# Patient Record
Sex: Female | Born: 1995 | Race: Black or African American | Hispanic: No | Marital: Single | Smoking: Never smoker
Health system: Southern US, Community
[De-identification: ages and names within clinical notes are randomized; demographics above are authoritative.]

## PROBLEM LIST (undated history)

## (undated) DIAGNOSIS — R011 Cardiac murmur, unspecified: Secondary | ICD-10-CM

## (undated) DIAGNOSIS — I10 Essential (primary) hypertension: Secondary | ICD-10-CM

## (undated) DIAGNOSIS — K5900 Constipation, unspecified: Secondary | ICD-10-CM

## (undated) DIAGNOSIS — T7840XA Allergy, unspecified, initial encounter: Secondary | ICD-10-CM

## (undated) DIAGNOSIS — G8929 Other chronic pain: Secondary | ICD-10-CM

## (undated) DIAGNOSIS — K219 Gastro-esophageal reflux disease without esophagitis: Secondary | ICD-10-CM

## (undated) DIAGNOSIS — M25569 Pain in unspecified knee: Secondary | ICD-10-CM

## (undated) DIAGNOSIS — J45909 Unspecified asthma, uncomplicated: Secondary | ICD-10-CM

## (undated) DIAGNOSIS — E282 Polycystic ovarian syndrome: Secondary | ICD-10-CM

## (undated) HISTORY — DX: Constipation, unspecified: K59.00

## (undated) HISTORY — DX: Allergy, unspecified, initial encounter: T78.40XA

## (undated) HISTORY — DX: Unspecified asthma, uncomplicated: J45.909

## (undated) HISTORY — DX: Polycystic ovarian syndrome: E28.2

---
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
## 2016-07-19 ENCOUNTER — Emergency Department (HOSPITAL_COMMUNITY)
Admission: EM | Admit: 2016-07-19 | Discharge: 2016-07-19 | Disposition: A | Discharge status: Home / Self Care | Payer: Medicaid Other | Attending: Emergency Medicine | Admitting: Emergency Medicine

## 2016-07-19 ENCOUNTER — Encounter (HOSPITAL_COMMUNITY): Payer: Self-pay | Admitting: Emergency Medicine

## 2016-07-19 ENCOUNTER — Emergency Department (HOSPITAL_COMMUNITY): Payer: Medicaid Other

## 2016-07-19 DIAGNOSIS — Z79899 Other long term (current) drug therapy: Secondary | ICD-10-CM | POA: Diagnosis not present

## 2016-07-19 DIAGNOSIS — J40 Bronchitis, not specified as acute or chronic: Secondary | ICD-10-CM | POA: Insufficient documentation

## 2016-07-19 DIAGNOSIS — R05 Cough: Secondary | ICD-10-CM | POA: Diagnosis present

## 2016-07-19 LAB — BASIC METABOLIC PANEL
Anion gap: 9 (ref 5–15)
BUN: 11 mg/dL (ref 6–20)
CO2: 23 mmol/L (ref 22–32)
Calcium: 9.4 mg/dL (ref 8.9–10.3)
Chloride: 107 mmol/L (ref 101–111)
Creatinine, Ser: 0.72 mg/dL (ref 0.44–1.00)
GFR calc Af Amer: >60 mL/min (ref >60)
GFR calc non Af Amer: >60 mL/min (ref >60)
Glucose, Bld: 80 mg/dL (ref 65–99)
Potassium: 4 mmol/L (ref 3.5–5.1)
Sodium: 139 mmol/L (ref 135–145)

## 2016-07-19 LAB — CBC WITH DIFFERENTIAL/PLATELET
Basophils Absolute: 0 10*3/uL (ref 0.0–0.1)
Basophils Relative: 0 %
Eosinophils Absolute: 0.1 10*3/uL (ref 0.0–0.7)
Eosinophils Relative: 2 %
HCT: 44.6 % (ref 36.0–46.0)
Hemoglobin: 14.5 g/dL (ref 12.0–15.0)
Lymphocytes Relative: 48 %
Lymphs Abs: 1.7 10*3/uL (ref 0.7–4.0)
MCH: 27.9 pg (ref 26.0–34.0)
MCHC: 32.5 g/dL (ref 30.0–36.0)
MCV: 85.9 fL (ref 78.0–100.0)
Monocytes Absolute: 0.2 10*3/uL (ref 0.1–1.0)
Monocytes Relative: 6 %
Neutro Abs: 1.5 10*3/uL — ABNORMAL LOW (ref 1.7–7.7)
Neutrophils Relative %: 44 %
Platelets: 266 10*3/uL (ref 150–400)
RBC: 5.19 MIL/uL — ABNORMAL HIGH (ref 3.87–5.11)
RDW: 12.2 % (ref 11.5–15.5)
WBC: 3.4 10*3/uL — ABNORMAL LOW (ref 4.0–10.5)

## 2016-07-19 LAB — D-DIMER, QUANTITATIVE: D-Dimer, Quant: <0.27 ug/mL-FEU (ref 0.00–0.50)

## 2016-07-19 MED ORDER — TRAMADOL HCL 50 MG PO TABS
50.0000 mg | ORAL_TABLET | Freq: Four times a day (QID) | ORAL | 0 refills | Status: DC | PRN
Start: 2016-07-19 — End: 2016-10-14

## 2016-07-19 MED ORDER — TRAMADOL HCL 50 MG PO TABS
50.0000 mg | ORAL_TABLET | Freq: Once | ORAL | Status: AC
  Administered 2016-07-19: 50 mg via ORAL
  Filled 2016-07-19: qty 1

## 2016-07-19 NOTE — ED Provider Notes (Signed)
[facility]PT Provider Note   CSN: [id number] Arrival date & time: 07/19/16  1058     History   Chief Complaint Chief Complaint  Patient presents with  . Ribcage pain    HPI Elaine Hunt is a 21 y.o. female.  Patient complains of a mild cough for 2 days nonproductive she also has left-sided lower anterior chest discomfort with inspiration    Cough  This is a new problem. The current episode started 2 days ago. The problem occurs constantly. The cough is non-productive. There has been no fever. Associated symptoms include chest pain. Pertinent negatives include no headaches. She has tried nothing for the symptoms. The treatment provided no relief. She is not a smoker.    Past Medical History:  Diagnosis Date  . Allergy   . Asthma   . Chronic knee pain   . Constipation   . GERD (gastroesophageal reflux disease)   . Heart murmur     Patient Active Problem List   Diagnosis Date Noted  . Normal weight, pediatric, BMI 5th to 84th percentile for age 26/19/2016  . Asthma, chronic 03/23/2013  . Acne 03/23/2013  . Knee pain, bilateral 03/23/2013  . Unspecified constipation 03/23/2013    History reviewed. No pertinent surgical history.  OB History    Gravida Para Term Preterm AB Living   0 0 0 0 0 0   SAB TAB Ectopic Multiple Live Births   0 0 0 0         Home Medications    Prior to Admission medications   Medication Sig Start Date End Date Taking? Authorizing Provider  medroxyPROGESTERone (DEPO-PROVERA) 150 MG/ML injection Inject 150 mg into the muscle every 3 (three) months.   Yes Historical Provider, MD  traMADol (ULTRAM) 50 MG tablet Take 1 tablet (50 mg total) by mouth every 6 (six) hours as needed. 07/19/16   [name], MD    Family History Family History  Problem Relation Age of Onset  . Heart failure Mother   . Hypertension Mother   . Arthritis Mother   . Asthma Mother   . COPD Mother   . Depression Mother   . Hyperlipidemia Mother   .  Kidney disease Mother   . Anxiety disorder Mother   . Panic disorder Mother   . Cancer Father     brain tumor  . Stroke Maternal Grandmother   . Heart disease Maternal Grandmother   . Marfan syndrome Maternal Grandmother   . Hyperlipidemia Maternal Grandmother   . Hypertension Maternal Uncle   . Heart failure Other   . Stroke Other     Social History Social History  Substance Use Topics  . Smoking status: Never Smoker  . Smokeless tobacco: Never Used  . Alcohol use No     Allergies   Ibuprofen and Penicillins   Review of Systems Review of Systems  Constitutional: Negative for appetite change and fatigue.  HENT: Negative for congestion, ear discharge and sinus pressure.   Eyes: Negative for discharge.  Respiratory: Positive for cough.   Cardiovascular: Positive for chest pain.  Gastrointestinal: Negative for abdominal pain and diarrhea.  Genitourinary: Negative for frequency and hematuria.  Musculoskeletal: Negative for back pain.  Skin: Negative for rash.  Neurological: Negative for seizures and headaches.  Psychiatric/Behavioral: Negative for hallucinations.     Physical Exam Updated Vital Signs BP (!) 135/95 (BP Location: Right Arm)   Pulse (!) 108   Temp 98.2 F (36.8 C) (Oral)   Resp  16   Ht 5\' 1"  (1.549 m)   Wt 167 lb (75.8 kg)   LMP  (LMP Unknown)   SpO2 100%   BMI 31.55 kg/m   Physical Exam  Constitutional: She is oriented to person, place, and time. She appears well-developed.  HENT:  Head: Normocephalic.  Eyes: Conjunctivae and EOM are normal. No scleral icterus.  Neck: Neck supple. No thyromegaly present.  Cardiovascular: Normal rate and regular rhythm.  Exam reveals no gallop and no friction rub.   No murmur heard. Pulmonary/Chest: No stridor. She has no wheezes. She has no rales. She exhibits no tenderness.  Tender anterior lower chest  Abdominal: She exhibits no distension. There is no tenderness. There is no rebound.  Musculoskeletal:  Normal range of motion. She exhibits no edema.  Lymphadenopathy:    She has no cervical adenopathy.  Neurological: She is oriented to person, place, and time. She exhibits normal muscle tone. Coordination normal.  Skin: No rash noted. No erythema.  Psychiatric: She has a normal mood and affect. Her behavior is normal.     ED Treatments / Results  Labs (all labs ordered are listed, but only abnormal results are displayed) Labs Reviewed  CBC WITH DIFFERENTIAL/PLATELET - Abnormal; Notable for the following:       Result Value   WBC 3.4 (*)    RBC 5.19 (*)    Neutro Abs 1.5 (*)    All other components within normal limits  BASIC METABOLIC PANEL  D-DIMER, QUANTITATIVE (NOT AT [facility])    EKG  EKG Interpretation None       Radiology Dg Chest 2 View  Result Date: 07/19/2016 CLINICAL DATA:  Cough for several days. EXAM: CHEST  2 VIEW COMPARISON:  08/05/2015. FINDINGS: Normal sized heart. Clear lungs. Mild diffuse peribronchial thickening. Unremarkable bones. IMPRESSION: Mild bronchitic changes. Electronically Signed   By: Beckie SaltsSteven  Reid M.D.   On: 07/19/2016 11:28    Procedures Procedures (including critical care time)  Medications Ordered in ED Medications  traMADol (ULTRAM) tablet 50 mg (50 mg Oral Given 07/19/16 1404)     Initial Impression / Assessment and Plan / ED Course  I have reviewed the triage vital signs and the nursing notes.  Pertinent labs & imaging results that were available during my care of the patient were reviewed by me and considered in my medical decision making (see chart for details).     Labs all unremarkable. Patient with bronchitis and pleuritis. She is treated just with pain medicine will follow-up as needed  Final Clinical Impressions(s) / ED Diagnoses   Final diagnoses:  Bronchitis    New Prescriptions New Prescriptions   TRAMADOL (ULTRAM) 50 MG TABLET    Take 1 tablet (50 mg total) by mouth every 6 (six) hours as needed.     Bethann BerkshireJoseph  Brandonlee Navis, MD 07/19/16 1414

## 2016-07-19 NOTE — Discharge Instructions (Signed)
Drink plenty of fluids and follow up with your md if not improving. °

## 2016-07-19 NOTE — ED Triage Notes (Signed)
Pt reports bilateral intercostal pain that worsens with deep inspiration x 2 days. States she has had an intermittent, non-productive cough.

## 2016-10-14 ENCOUNTER — Emergency Department (HOSPITAL_COMMUNITY): Payer: Medicaid Other

## 2016-10-14 ENCOUNTER — Encounter (HOSPITAL_COMMUNITY): Payer: Self-pay

## 2016-10-14 ENCOUNTER — Emergency Department (HOSPITAL_COMMUNITY)
Admission: EM | Admit: 2016-10-14 | Discharge: 2016-10-14 | Disposition: A | Discharge status: Home / Self Care | Payer: Medicaid Other | Attending: Emergency Medicine | Admitting: Emergency Medicine

## 2016-10-14 DIAGNOSIS — R1030 Lower abdominal pain, unspecified: Secondary | ICD-10-CM

## 2016-10-14 DIAGNOSIS — R52 Pain, unspecified: Secondary | ICD-10-CM

## 2016-10-14 DIAGNOSIS — Z79899 Other long term (current) drug therapy: Secondary | ICD-10-CM | POA: Insufficient documentation

## 2016-10-14 DIAGNOSIS — R1031 Right lower quadrant pain: Secondary | ICD-10-CM | POA: Insufficient documentation

## 2016-10-14 DIAGNOSIS — J45909 Unspecified asthma, uncomplicated: Secondary | ICD-10-CM | POA: Diagnosis not present

## 2016-10-14 LAB — CBC WITH DIFFERENTIAL/PLATELET
Basophils Absolute: 0 10*3/uL (ref 0.0–0.1)
Basophils Relative: 0 %
Eosinophils Absolute: 0 10*3/uL (ref 0.0–0.7)
Eosinophils Relative: 1 %
HCT: 44.4 % (ref 36.0–46.0)
Hemoglobin: 14.3 g/dL (ref 12.0–15.0)
Lymphocytes Relative: 36 %
Lymphs Abs: 1.7 10*3/uL (ref 0.7–4.0)
MCH: 27.4 pg (ref 26.0–34.0)
MCHC: 32.2 g/dL (ref 30.0–36.0)
MCV: 85.2 fL (ref 78.0–100.0)
Monocytes Absolute: 0.3 10*3/uL (ref 0.1–1.0)
Monocytes Relative: 7 %
Neutro Abs: 2.7 10*3/uL (ref 1.7–7.7)
Neutrophils Relative %: 56 %
Platelets: 313 10*3/uL (ref 150–400)
RBC: 5.21 MIL/uL — ABNORMAL HIGH (ref 3.87–5.11)
RDW: 13.1 % (ref 11.5–15.5)
WBC: 4.8 10*3/uL (ref 4.0–10.5)

## 2016-10-14 LAB — COMPREHENSIVE METABOLIC PANEL
ALT: 26 U/L (ref 14–54)
AST: 24 U/L (ref 15–41)
Albumin: 4.6 g/dL (ref 3.5–5.0)
Alkaline Phosphatase: 85 U/L (ref 38–126)
Anion gap: 12 (ref 5–15)
BUN: 12 mg/dL (ref 6–20)
CO2: 21 mmol/L — ABNORMAL LOW (ref 22–32)
Calcium: 9.7 mg/dL (ref 8.9–10.3)
Chloride: 105 mmol/L (ref 101–111)
Creatinine, Ser: 0.73 mg/dL (ref 0.44–1.00)
GFR calc Af Amer: >60 mL/min (ref >60)
GFR calc non Af Amer: >60 mL/min (ref >60)
Glucose, Bld: 88 mg/dL (ref 65–99)
Potassium: 4 mmol/L (ref 3.5–5.1)
Sodium: 138 mmol/L (ref 135–145)
Total Bilirubin: 0.7 mg/dL (ref 0.3–1.2)
Total Protein: 7.9 g/dL (ref 6.5–8.1)

## 2016-10-14 LAB — URINALYSIS, ROUTINE W REFLEX MICROSCOPIC
Bilirubin Urine: NEGATIVE
Glucose, UA: NEGATIVE mg/dL
Ketones, ur: NEGATIVE mg/dL
Leukocytes, UA: NEGATIVE
Nitrite: NEGATIVE
Protein, ur: NEGATIVE mg/dL
Specific Gravity, Urine: 1.021 (ref 1.005–1.030)
pH: 5 (ref 5.0–8.0)

## 2016-10-14 LAB — PREGNANCY, URINE: Preg Test, Ur: NEGATIVE

## 2016-10-14 MED ORDER — TRAMADOL HCL 50 MG PO TABS
50.0000 mg | ORAL_TABLET | Freq: Once | ORAL | Status: AC
  Administered 2016-10-14: 50 mg via ORAL
  Filled 2016-10-14: qty 1

## 2016-10-14 MED ORDER — TRAMADOL HCL 50 MG PO TABS: 50.0000 mg | ORAL_TABLET | Freq: Four times a day (QID) | ORAL | 0 refills | Status: DC | PRN

## 2016-10-14 NOTE — ED Notes (Signed)
Patient transported to CT 

## 2016-10-14 NOTE — ED Provider Notes (Signed)
[facility] Provider Note   CSN: [id number] Arrival date & time: 10/14/16  1933     History   Chief Complaint Chief Complaint  Patient presents with  . Abdominal Pain    HPI Elaine Hunt is a 21 y.o. female.  Patient complains of right lower quadrant abdominal pain.   The history is provided by the patient. No language interpreter was used.  Abdominal Pain   This is a recurrent problem. The current episode started more than 2 days ago. The problem occurs constantly. The problem has not changed since onset.The pain is associated with an unknown factor. The pain is located in the RLQ. The quality of the pain is aching. The pain is at a severity of 4/10. The pain is mild. Pertinent negatives include diarrhea, frequency, hematuria and headaches.    Past Medical History:  Diagnosis Date  . Allergy   . Asthma   . Chronic knee pain   . Constipation   . GERD (gastroesophageal reflux disease)   . Heart murmur     Patient Active Problem List   Diagnosis Date Noted  . Normal weight, pediatric, BMI 5th to 84th percentile for age 102/19/2016  . Asthma, chronic 03/23/2013  . Acne 03/23/2013  . Knee pain, bilateral 03/23/2013  . Unspecified constipation 03/23/2013    History reviewed. No pertinent surgical history.  OB History    Gravida Para Term Preterm AB Living   0 0 0 0 0 0   SAB TAB Ectopic Multiple Live Births   0 0 0 0         Home Medications    Prior to Admission medications   Medication Sig Start Date End Date Taking? Authorizing Provider  medroxyPROGESTERone (DEPO-PROVERA) 150 MG/ML injection Inject 150 mg into the muscle every 3 (three) months.   Yes [provider]  traMADol (ULTRAM) 50 MG tablet Take 1 tablet (50 mg total) by mouth every 6 (six) hours as needed. 10/14/16   Bethann BerkshireZammit, Silvester Reierson, MD    Family History Family History  Problem Relation Age of Onset  . Heart failure Mother   . Hypertension Mother   . Arthritis Mother   .  Asthma Mother   . COPD Mother   . Depression Mother   . Hyperlipidemia Mother   . Kidney disease Mother   . Anxiety disorder Mother   . Panic disorder Mother   . Cancer Father        brain tumor  . Stroke Maternal Grandmother   . Heart disease Maternal Grandmother   . Marfan syndrome Maternal Grandmother   . Hyperlipidemia Maternal Grandmother   . Hypertension Maternal Uncle   . Heart failure Other   . Stroke Other     Social History Social History  Substance Use Topics  . Smoking status: Never Smoker  . Smokeless tobacco: Never Used  . Alcohol use No     Allergies   Ibuprofen and Penicillins   Review of Systems Review of Systems  Constitutional: Negative for appetite change and fatigue.  HENT: Negative for congestion, ear discharge and sinus pressure.   Eyes: Negative for discharge.  Respiratory: Negative for cough.   Cardiovascular: Negative for chest pain.  Gastrointestinal: Positive for abdominal pain. Negative for diarrhea.  Genitourinary: Negative for frequency and hematuria.  Musculoskeletal: Negative for back pain.  Skin: Negative for rash.  Neurological: Negative for seizures and headaches.  Psychiatric/Behavioral: Negative for hallucinations.     Physical Exam Updated Vital Signs BP (!) 130/94  Pulse (!) 104   Temp 98.5 F (36.9 C) (Oral)   Resp 20   Ht 5\' 1"  (1.549 m)   Wt 78.9 kg (174 lb)   SpO2 100%   BMI 32.88 kg/m   Physical Exam  Constitutional: She is oriented to person, place, and time. She appears well-developed.  HENT:  Head: Normocephalic.  Eyes: Conjunctivae and EOM are normal. No scleral icterus.  Neck: Neck supple. No thyromegaly present.  Cardiovascular: Normal rate and regular rhythm.  Exam reveals no gallop and no friction rub.   No murmur heard. Pulmonary/Chest: No stridor. She has no wheezes. She has no rales. She exhibits no tenderness.  Abdominal: She exhibits no distension. There is tenderness. There is no rebound.    Mild right lower quadrant tenderness  Musculoskeletal: Normal range of motion. She exhibits no edema.  Lymphadenopathy:    She has no cervical adenopathy.  Neurological: She is oriented to person, place, and time. She exhibits normal muscle tone. Coordination normal.  Skin: No rash noted. No erythema.  Psychiatric: She has a normal mood and affect. Her behavior is normal.     ED Treatments / Results  Labs (all labs ordered are listed, but only abnormal results are displayed) Labs Reviewed  URINALYSIS, ROUTINE W REFLEX MICROSCOPIC - Abnormal; Notable for the following:       Result Value   Hgb urine dipstick LARGE (*)    Bacteria, UA RARE (*)    Squamous Epithelial / LPF 0-5 (*)    All other components within normal limits  CBC WITH DIFFERENTIAL/PLATELET - Abnormal; Notable for the following:    RBC 5.21 (*)    All other components within normal limits  COMPREHENSIVE METABOLIC PANEL - Abnormal; Notable for the following:    CO2 21 (*)    All other components within normal limits  PREGNANCY, URINE    EKG  EKG Interpretation None       Radiology Ct Renal Stone Study  Result Date: 10/14/2016 CLINICAL DATA:  Right pelvic/ovarian pain on and off for several months. EXAM: CT ABDOMEN AND PELVIS WITHOUT CONTRAST TECHNIQUE: Multidetector CT imaging of the abdomen and pelvis was performed following the standard low-dose stone protocol without IV contrast. COMPARISON:  05/01/2014 FINDINGS: LOWER CHEST: Lung bases are clear. Included heart size is normal. No pericardial effusion. HEPATOBILIARY: Liver and gallbladder are normal. PANCREAS: Normal. SPLEEN: Normal. ADRENALS/URINARY TRACT: Kidneys are orthotopic. No nephrolithiasis, hydronephrosis or solid renal masses. The unopacified ureters are normal in course and caliber. Urinary bladder is partially distended and unremarkable. Normal adrenal glands. STOMACH/BOWEL: The stomach, small and large bowel are normal in course and caliber  without inflammatory changes. Normal appendix. VASCULAR/LYMPHATIC: Aortoiliac vessels are normal in course and caliber. No lymphadenopathy by CT size criteria. REPRODUCTIVE: Normal. OTHER: No intraperitoneal free fluid or free air. MUSCULOSKELETAL: Nonacute. IMPRESSION: 1. No nephrolithiasis or obstructive uropathy. 2. Unremarkable uterus and adnexa. 3. Normal-appearing appendix.  No bowel inflammation. Electronically Signed   By: Tollie Eth M.D.   On: 10/14/2016 21:48    Procedures Procedures (including critical care time)  Medications Ordered in ED Medications - No data to display   Initial Impression / Assessment and Plan / ED Course  I have reviewed the triage vital signs and the nursing notes.  Pertinent labs & imaging results that were available during my care of the patient were reviewed by me and considered in my medical decision making (see chart for details).     Labs unremarkable except for  blood in her urine. Patient states she's been spotting recently so I suspect the blood is from her uterus. CT scan unremarkable. Patient will be given some Ultram referred to GYN or she could follow-up with her primary care doctor  Final Clinical Impressions(s) / ED Diagnoses   Final diagnoses:  Pain  Lower abdominal pain    New Prescriptions New Prescriptions   TRAMADOL (ULTRAM) 50 MG TABLET    Take 1 tablet (50 mg total) by mouth every 6 (six) hours as needed.     [name], MD 10/14/16 2321

## 2016-10-14 NOTE — ED Notes (Signed)
Pt going to xray  

## 2016-10-14 NOTE — ED Triage Notes (Signed)
Patient states that she is having burning and sharp pain in her right ovary.  I has bothered me for several months, and it hurts, then goes away.  Never been diagnosed with ovarian cysts or anything else.

## 2016-10-14 NOTE — ED Notes (Signed)
Pt alert & oriented x4, stable gait. Patient given discharge instructions, paperwork & prescription(s). Patient  instructed to stop at the registration desk to finish any additional paperwork. Patient verbalized understanding. Pt left department w/ no further questions. 

## 2016-10-14 NOTE — Discharge Instructions (Signed)
Follow up with dr. Despina HiddenEure or your md next week for recheck

## 2017-09-19 ENCOUNTER — Emergency Department (HOSPITAL_COMMUNITY)
Admission: EM | Admit: 2017-09-19 | Discharge: 2017-09-19 | Disposition: A | Discharge status: Home / Self Care | Payer: Medicaid Other | Attending: Emergency Medicine | Admitting: Emergency Medicine

## 2017-09-19 ENCOUNTER — Other Ambulatory Visit: Payer: Self-pay

## 2017-09-19 ENCOUNTER — Encounter (HOSPITAL_COMMUNITY): Payer: Self-pay | Admitting: Emergency Medicine

## 2017-09-19 DIAGNOSIS — N739 Female pelvic inflammatory disease, unspecified: Secondary | ICD-10-CM

## 2017-09-19 DIAGNOSIS — Z79899 Other long term (current) drug therapy: Secondary | ICD-10-CM | POA: Insufficient documentation

## 2017-09-19 DIAGNOSIS — I10 Essential (primary) hypertension: Secondary | ICD-10-CM | POA: Diagnosis not present

## 2017-09-19 DIAGNOSIS — R1030 Lower abdominal pain, unspecified: Secondary | ICD-10-CM | POA: Diagnosis present

## 2017-09-19 DIAGNOSIS — J45909 Unspecified asthma, uncomplicated: Secondary | ICD-10-CM | POA: Diagnosis not present

## 2017-09-19 HISTORY — DX: Essential (primary) hypertension: I10

## 2017-09-19 LAB — COMPREHENSIVE METABOLIC PANEL
ALT: 24 U/L (ref 14–54)
AST: 21 U/L (ref 15–41)
Albumin: 4.7 g/dL (ref 3.5–5.0)
Alkaline Phosphatase: 66 U/L (ref 38–126)
Anion gap: 9 (ref 5–15)
BUN: 9 mg/dL (ref 6–20)
CO2: 24 mmol/L (ref 22–32)
Calcium: 9.5 mg/dL (ref 8.9–10.3)
Chloride: 106 mmol/L (ref 101–111)
Creatinine, Ser: 0.81 mg/dL (ref 0.44–1.00)
GFR calc Af Amer: >60 mL/min (ref >60)
GFR calc non Af Amer: >60 mL/min (ref >60)
Glucose, Bld: 82 mg/dL (ref 65–99)
Potassium: 3.4 mmol/L — ABNORMAL LOW (ref 3.5–5.1)
Sodium: 139 mmol/L (ref 135–145)
Total Bilirubin: 0.9 mg/dL (ref 0.3–1.2)
Total Protein: 8.1 g/dL (ref 6.5–8.1)

## 2017-09-19 LAB — CBC
HCT: 42.9 % (ref 36.0–46.0)
Hemoglobin: 13.7 g/dL (ref 12.0–15.0)
MCH: 27.7 pg (ref 26.0–34.0)
MCHC: 31.9 g/dL (ref 30.0–36.0)
MCV: 86.8 fL (ref 78.0–100.0)
Platelets: 290 10*3/uL (ref 150–400)
RBC: 4.94 MIL/uL (ref 3.87–5.11)
RDW: 12.7 % (ref 11.5–15.5)
WBC: 4.4 10*3/uL (ref 4.0–10.5)

## 2017-09-19 LAB — URINALYSIS, ROUTINE W REFLEX MICROSCOPIC
Bilirubin Urine: NEGATIVE
Glucose, UA: NEGATIVE mg/dL
Ketones, ur: NEGATIVE mg/dL
Leukocytes, UA: NEGATIVE
Nitrite: NEGATIVE
Protein, ur: NEGATIVE mg/dL
Specific Gravity, Urine: 1.026 (ref 1.005–1.030)
pH: 5 (ref 5.0–8.0)

## 2017-09-19 LAB — PREGNANCY, URINE: Preg Test, Ur: NEGATIVE

## 2017-09-19 LAB — LIPASE, BLOOD: Lipase: 24 U/L (ref 11–51)

## 2017-09-19 LAB — WET PREP, GENITAL
Clue Cells Wet Prep HPF POC: NONE SEEN
Sperm: NONE SEEN
Trich, Wet Prep: NONE SEEN

## 2017-09-19 MED ORDER — LIDOCAINE HCL (PF) 1 % IJ SOLN
INTRAMUSCULAR | Status: AC
  Administered 2017-09-19: 2 mL
  Filled 2017-09-19: qty 2

## 2017-09-19 MED ORDER — FLUCONAZOLE 150 MG PO TABS
150.0000 mg | ORAL_TABLET | Freq: Once | ORAL | Status: AC
  Administered 2017-09-19: 150 mg via ORAL
  Filled 2017-09-19: qty 1

## 2017-09-19 MED ORDER — CEFTRIAXONE SODIUM 250 MG IJ SOLR
250.0000 mg | Freq: Once | INTRAMUSCULAR | Status: AC
  Administered 2017-09-19: 250 mg via INTRAMUSCULAR
  Filled 2017-09-19: qty 250

## 2017-09-19 MED ORDER — DOXYCYCLINE HYCLATE 100 MG PO TABS
100.0000 mg | ORAL_TABLET | Freq: Once | ORAL | Status: AC
Start: 2017-09-19 — End: 2017-09-19
  Administered 2017-09-19: 100 mg via ORAL
  Filled 2017-09-19: qty 1

## 2017-09-19 MED ORDER — METOCLOPRAMIDE HCL 10 MG PO TABS: 10.0000 mg | ORAL_TABLET | Freq: Four times a day (QID) | ORAL | 0 refills | Status: DC | PRN

## 2017-09-19 MED ORDER — POTASSIUM CHLORIDE CRYS ER 20 MEQ PO TBCR
40.0000 meq | EXTENDED_RELEASE_TABLET | Freq: Once | ORAL | Status: AC
  Administered 2017-09-19: 40 meq via ORAL
  Filled 2017-09-19: qty 2

## 2017-09-19 MED ORDER — MORPHINE SULFATE (PF) 4 MG/ML IV SOLN
4.0000 mg | Freq: Once | INTRAVENOUS | Status: AC
  Administered 2017-09-19: 4 mg via INTRAVENOUS
  Filled 2017-09-19: qty 1

## 2017-09-19 MED ORDER — DOXYCYCLINE HYCLATE 100 MG PO CAPS: 100.0000 mg | ORAL_CAPSULE | Freq: Two times a day (BID) | ORAL | 0 refills | Status: DC

## 2017-09-19 NOTE — ED Triage Notes (Addendum)
Patient complaining of lower abdominal pain x 3 weeks. Also complaining of vomiting and constipation. Also complaining of burning with urination x 3 weeks.

## 2017-09-19 NOTE — Discharge Instructions (Signed)
Use a condom each time that you have sex. Take Tylenol or Advil as directed for pain. You can take the medication prescribed as needed for nausea. Make sure that he complete the antibiotic( doxycycline) as prescribed. See your doctor at [facility] if not improved in 2 or 3 days. Return if you have continued vomiting,or unable to hold down the medications prescribed without vomiting, or feel worse for any reason

## 2017-09-19 NOTE — ED Provider Notes (Signed)
[facility] EMERGENCY DEPARTMENT Provider Note   CSN: [id number] Arrival date & time: 09/19/17  1855     History   Chief Complaint Chief Complaint  Patient presents with  . Abdominal Pain    HPI Elaine Hunt is a 22 y.o. female.complains of lower abdominal pain gradual onset 3 weeks ago.Pain is constant. Worse with sitting or walking improved with remaining still. No fever. She vomited one time today and one episode of diarrhea today she reports intermittent constipation and diarrhea. She has had similar pain in the past. He etiology unclear. She also reports slight burning with urination. Denies any fever. No treatment prior to coming here. No other associated symptoms no blood per rectum. No hematemesis. Presently hungrydenies vaginal discharge deniesdyspareunia  HPI  Past Medical History:  Diagnosis Date  . Allergy   . Asthma   . Chronic knee pain   . Constipation   . GERD (gastroesophageal reflux disease)   . Heart murmur   . Hypertension     Patient Active Problem List   Diagnosis Date Noted  . Normal weight, pediatric, BMI 5th to 84th percentile for age 23/19/2016  . Asthma, chronic 03/23/2013  . Acne 03/23/2013  . Knee pain, bilateral 03/23/2013  . Unspecified constipation 03/23/2013    History reviewed. No pertinent surgical history.   OB History    Gravida  0   Para  0   Term  0   Preterm  0   AB  0   Living  0     SAB  0   TAB  0   Ectopic  0   Multiple  0   Live Births               Home Medications    Prior to Admission medications   Medication Sig Start Date End Date Taking? Authorizing Provider  medroxyPROGESTERone (DEPO-PROVERA) 150 MG/ML injection Inject 150 mg into the muscle every 3 (three) months.    [provider]    Family History Family History  Problem Relation Age of Onset  . Heart failure Mother   . Hypertension Mother   . Arthritis Mother   . Asthma Mother   . COPD Mother   . Depression Mother    . Hyperlipidemia Mother   . Kidney disease Mother   . Anxiety disorder Mother   . Panic disorder Mother   . Cancer Father        brain tumor  . Stroke Maternal Grandmother   . Heart disease Maternal Grandmother   . Marfan syndrome Maternal Grandmother   . Hyperlipidemia Maternal Grandmother   . Hypertension Maternal Uncle   . Heart failure Other   . Stroke Other     Social History Social History   Tobacco Use  . Smoking status: Never Smoker  . Smokeless tobacco: Never Used  Substance Use Topics  . Alcohol use: No    Alcohol/week: 0.0 oz  . Drug use: No     Allergies   Ibuprofen and Penicillins   Review of Systems Review of Systems  Gastrointestinal: Positive for abdominal pain, constipation, diarrhea and vomiting.  Genitourinary: Positive for dysuria.  All other systems reviewed and are negative.    Physical Exam Updated Vital Signs BP (!) 147/89 (BP Location: Right Arm)   Pulse (!) 131   Temp 99.4 F (37.4 C) (Oral)   Resp 20   Ht  (1.549 m)   Wt 81.6 kg (180 lb)   SpO2  99%   BMI 34.01 kg/m   Physical Exam  Constitutional: She appears well-developed and well-nourished. No distress.  HENT:  Head: Normocephalic and atraumatic.  Eyes: Pupils are equal, round, and reactive to light. Conjunctivae are normal.  Neck: Neck supple. No tracheal deviation present. No thyromegaly present.  Cardiovascular: Normal rate and regular rhythm.  No murmur heard. Pulse counted 92 bpm by me  Pulmonary/Chest: Effort normal and breath sounds normal.  Abdominal: Soft. Bowel sounds are normal. She exhibits no distension and no mass. There is tenderness. There is no guarding.  Obese tender over bilateral lower quadrants and infraumbilical area  Genitourinary:  Genitourinary Comments: Pelvic exam no external lesion. Copious watery discharge. Cervix is red and friable and cervical motion tenderness is present. Cervical os closed. Mild bilateral adnexal tenderness.    Musculoskeletal: Normal range of motion. She exhibits no edema or tenderness.  Neurological: She is alert. Coordination normal.  Skin: Skin is warm and Hunt. Capillary refill takes less than 2 seconds. No rash noted.  Psychiatric: She has a normal mood and affect.  Nursing note and vitals reviewed.    ED Treatments / Results  Labs (all labs ordered are listed, but only abnormal results are displayed) Labs Reviewed  COMPREHENSIVE METABOLIC PANEL - Abnormal; Notable for the following components:      Result Value   Potassium 3.4 (*)    All other components within normal limits  URINALYSIS, ROUTINE W REFLEX MICROSCOPIC - Abnormal; Notable for the following components:   APPearance HAZY (*)    Hgb urine dipstick MODERATE (*)    Bacteria, UA RARE (*)    All other components within normal limits  LIPASE, BLOOD  CBC  PREGNANCY, URINE    EKG None  Radiology No results found.  Procedures Procedures (including critical care time)  Medications Ordered in ED Medications - No data to display   Initial Impression / Assessment and Plan / ED Course  I have reviewed the triage vital signs and the nursing notes.  Pertinent labs & imaging results that were available during my care of the patient were reviewed by me and considered in my medical decision making (see chart for details).   1045 p.m. Feels much improved after treatment with intravenous morphine.  history and exam consistent with low-grade pelvic inflammatory disease. Lab work consistent with yeast vaginitis. She'll receive Rocephin 250 Mill grams IM prior to discharge, as well as oral potassium supplementation anddoxycycline orally. Prescription Reglan, doxycycline. Follow up with PCP at [facility]. Safe sex encouraged.  Final Clinical Impressions(s) / ED Diagnoses  Diagnosis #1 pelvic inflammatory disease #2 yeast vaginitis Final diagnoses:  None  #3 hypokalemia  ED Discharge Orders    None        Doug Sou, MD 09/19/17 2254

## 2017-09-21 LAB — HIV ANTIBODY (ROUTINE TESTING W REFLEX): HIV Screen 4th Generation wRfx: NONREACTIVE

## 2017-09-21 LAB — GC/CHLAMYDIA PROBE AMP (~~LOC~~) NOT AT ARMC
Chlamydia: NEGATIVE
Neisseria Gonorrhea: NEGATIVE

## 2017-09-21 LAB — RPR: RPR Ser Ql: NONREACTIVE

## 2019-08-05 ENCOUNTER — Other Ambulatory Visit: Payer: Self-pay

## 2019-08-05 ENCOUNTER — Encounter (HOSPITAL_COMMUNITY): Payer: Self-pay

## 2019-08-05 ENCOUNTER — Emergency Department (HOSPITAL_COMMUNITY)
Admission: EM | Admit: 2019-08-05 | Discharge: 2019-08-05 | Disposition: A | Discharge status: Home / Self Care | Payer: 59 | Attending: Emergency Medicine | Admitting: Emergency Medicine

## 2019-08-05 DIAGNOSIS — J45909 Unspecified asthma, uncomplicated: Secondary | ICD-10-CM | POA: Diagnosis not present

## 2019-08-05 DIAGNOSIS — R102 Pelvic and perineal pain: Secondary | ICD-10-CM | POA: Diagnosis present

## 2019-08-05 DIAGNOSIS — N72 Inflammatory disease of cervix uteri: Secondary | ICD-10-CM | POA: Insufficient documentation

## 2019-08-05 LAB — COMPREHENSIVE METABOLIC PANEL
ALT: 53 U/L — ABNORMAL HIGH (ref 0–44)
AST: 30 U/L (ref 15–41)
Albumin: 4.7 g/dL (ref 3.5–5.0)
Alkaline Phosphatase: 75 U/L (ref 38–126)
Anion gap: 10 (ref 5–15)
BUN: 17 mg/dL (ref 6–20)
CO2: 27 mmol/L (ref 22–32)
Calcium: 9.2 mg/dL (ref 8.9–10.3)
Chloride: 99 mmol/L (ref 98–111)
Creatinine, Ser: 1.42 mg/dL — ABNORMAL HIGH (ref 0.44–1.00)
GFR calc Af Amer: >60 mL/min (ref >60)
GFR calc non Af Amer: 52 mL/min — ABNORMAL LOW (ref >60)
Glucose, Bld: 93 mg/dL (ref 70–99)
Potassium: 3.7 mmol/L (ref 3.5–5.1)
Sodium: 136 mmol/L (ref 135–145)
Total Bilirubin: 0.6 mg/dL (ref 0.3–1.2)
Total Protein: 7.6 g/dL (ref 6.5–8.1)

## 2019-08-05 LAB — CBC WITH DIFFERENTIAL/PLATELET
Abs Immature Granulocytes: 0.01 10*3/uL (ref 0.00–0.07)
Basophils Absolute: 0 10*3/uL (ref 0.0–0.1)
Basophils Relative: 1 %
Eosinophils Absolute: 0.1 10*3/uL (ref 0.0–0.5)
Eosinophils Relative: 3 %
HCT: 42.8 % (ref 36.0–46.0)
Hemoglobin: 13.4 g/dL (ref 12.0–15.0)
Immature Granulocytes: 0 %
Lymphocytes Relative: 45 %
Lymphs Abs: 1.9 10*3/uL (ref 0.7–4.0)
MCH: 28.6 pg (ref 26.0–34.0)
MCHC: 31.3 g/dL (ref 30.0–36.0)
MCV: 91.5 fL (ref 80.0–100.0)
Monocytes Absolute: 0.3 10*3/uL (ref 0.1–1.0)
Monocytes Relative: 6 %
Neutro Abs: 2 10*3/uL (ref 1.7–7.7)
Neutrophils Relative %: 45 %
Platelets: 291 10*3/uL (ref 150–400)
RBC: 4.68 MIL/uL (ref 3.87–5.11)
RDW: 12.4 % (ref 11.5–15.5)
WBC: 4.3 10*3/uL (ref 4.0–10.5)
nRBC: 0 % (ref 0.0–0.2)

## 2019-08-05 LAB — URINALYSIS, ROUTINE W REFLEX MICROSCOPIC
Bacteria, UA: NONE SEEN
Bilirubin Urine: NEGATIVE
Glucose, UA: NEGATIVE mg/dL
Ketones, ur: NEGATIVE mg/dL
Leukocytes,Ua: NEGATIVE
Nitrite: NEGATIVE
Protein, ur: NEGATIVE mg/dL
Specific Gravity, Urine: 1.004 — ABNORMAL LOW (ref 1.005–1.030)
pH: 7 (ref 5.0–8.0)

## 2019-08-05 LAB — WET PREP, GENITAL
Clue Cells Wet Prep HPF POC: NONE SEEN
Sperm: NONE SEEN
Trich, Wet Prep: NONE SEEN
WBC, Wet Prep HPF POC: NONE SEEN
Yeast Wet Prep HPF POC: NONE SEEN

## 2019-08-05 LAB — PREGNANCY, URINE: Preg Test, Ur: NEGATIVE

## 2019-08-05 MED ORDER — DOXYCYCLINE HYCLATE 100 MG PO TABS: 100.0000 mg | ORAL_TABLET | Freq: Two times a day (BID) | ORAL | 0 refills | Status: DC

## 2019-08-05 MED ORDER — SODIUM CHLORIDE 0.9 % IV SOLN
1.0000 g | Freq: Once | INTRAVENOUS | Status: AC
  Administered 2019-08-05: 1 g via INTRAVENOUS
  Filled 2019-08-05: qty 10

## 2019-08-05 NOTE — ED Provider Notes (Signed)
[facility] EMERGENCY DEPARTMENT Provider Note   CSN: [id number] Arrival date & time: 08/05/19  1945     History Chief Complaint  Patient presents with  . Abdominal Pain    hx PID    Elaine Hunt is a 24 y.o. female.  The history is provided by the patient. No language interpreter was used.  Abdominal Pain Pain location:  Suprapubic Pain quality: aching   Pain radiates to:  Does not radiate Timing:  Constant Progression:  Worsening Chronicity:  New Relieved by:  Nothing Worsened by:  Nothing Ineffective treatments:  None tried Associated symptoms: no chills, no constipation, no fever and no nausea   Risk factors: has not had multiple surgeries and not pregnant   Pt reports lower abdominal soreness.  Pt reports she has had similar pain in the past and was diagnosed with PID.  Pt reports she has been spotting blood and cramping     Past Medical History:  Diagnosis Date  . Allergy   . Asthma   . Chronic knee pain   . Constipation   . GERD (gastroesophageal reflux disease)   . Heart murmur   . Hypertension     Patient Active Problem List   Diagnosis Date Noted  . Normal weight, pediatric, BMI 5th to 84th percentile for age 33/19/2016  . Asthma, chronic 03/23/2013  . Acne 03/23/2013  . Knee pain, bilateral 03/23/2013  . Unspecified constipation 03/23/2013    History reviewed. No pertinent surgical history.   OB History    Gravida  0   Para  0   Term  0   Preterm  0   AB  0   Living  0     SAB  0   TAB  0   Ectopic  0   Multiple  0   Live Births              Family History  Problem Relation Age of Onset  . Heart failure Mother   . Hypertension Mother   . Arthritis Mother   . Asthma Mother   . COPD Mother   . Depression Mother   . Hyperlipidemia Mother   . Kidney disease Mother   . Anxiety disorder Mother   . Panic disorder Mother   . Cancer Father        brain tumor  . Stroke Maternal Grandmother   . Heart disease Maternal  Grandmother   . Marfan syndrome Maternal Grandmother   . Hyperlipidemia Maternal Grandmother   . Hypertension Maternal Uncle   . Heart failure Other   . Stroke Other     Social History   Tobacco Use  . Smoking status: Never Smoker  . Smokeless tobacco: Never Used  Substance Use Topics  . Alcohol use: No    Alcohol/week: 0.0 standard drinks  . Drug use: No    Home Medications Prior to Admission medications   Not on File    Allergies    Ibuprofen and Penicillins  Review of Systems   Review of Systems  Constitutional: Negative for chills and fever.  Gastrointestinal: Positive for abdominal pain. Negative for constipation and nausea.  All other systems reviewed and are negative.   Physical Exam Updated Vital Signs BP (!) 160/101   Pulse (!) 124   Temp 98.1 F (36.7 C)   Ht 5\' 1"  (1.549 m)   Wt 76.7 kg   LMP 07/01/2019 (Approximate)   SpO2 100%   BMI 31.93 kg/m  Physical Exam Vitals and nursing note reviewed.  Constitutional:      Appearance: She is well-developed.  HENT:     Head: Normocephalic.  Cardiovascular:     Rate and Rhythm: Normal rate and regular rhythm.  Pulmonary:     Effort: Pulmonary effort is normal.  Abdominal:     General: Abdomen is flat. There is no distension.     Palpations: Abdomen is soft.     Tenderness: There is abdominal tenderness in the suprapubic area.  Genitourinary:    Vagina: Vaginal discharge present.     Cervix: Cervical motion tenderness present.     Uterus: Normal.      Adnexa: Right adnexa normal and left adnexa normal.  Musculoskeletal:        General: Normal range of motion.     Cervical back: Normal range of motion.  Skin:    General: Skin is warm.  Neurological:     Mental Status: She is alert and oriented to person, place, and time.     ED Results / Procedures / Treatments   Labs (all labs ordered are listed, but only abnormal results are displayed) Labs Reviewed  COMPREHENSIVE METABOLIC PANEL -  Abnormal; Notable for the following components:      Result Value   Creatinine, Ser 1.42 (*)    ALT 53 (*)    GFR calc non Af Amer 52 (*)    All other components within normal limits  URINALYSIS, ROUTINE W REFLEX MICROSCOPIC - Abnormal; Notable for the following components:   Color, Urine COLORLESS (*)    Specific Gravity, Urine 1.004 (*)    Hgb urine dipstick SMALL (*)    All other components within normal limits  WET PREP, GENITAL  CBC WITH DIFFERENTIAL/PLATELET  PREGNANCY, URINE  GC/CHLAMYDIA PROBE AMP ([place]) NOT AT [facility]    EKG None  Radiology No results found.  Procedures Procedures (including critical care time)  Medications Ordered in ED Medications  cefTRIAXone (ROCEPHIN) 1 g in sodium chloride 0.9 % 100 mL IVPB (0 g Intravenous Stopped 08/05/19 2259)    ED Course  I have reviewed the triage vital signs and the nursing notes.  Pertinent labs & imaging results that were available during my care of the patient were reviewed by me and considered in my medical decision making (see chart for details).    MDM Rules/Calculators/A&P                      MDM:  Pt given Iv fluids and Rocephin 1 gram.  Wet prep normal  GC and Ct pedning.  I will give rx for doxycycline.  Pt advised to return if increased pain  Final Clinical Impression(s) / ED Diagnoses Final diagnoses:  Cervicitis    Rx / DC Orders ED Discharge Orders         Ordered    doxycycline (VIBRA-TABS) 100 MG tablet  2 times daily     08/05/19 2326        An After Visit Summary was printed and given to the patient.    [name] 08/05/19 2327    Milton Ferguson, MD 08/08/19 [phone]

## 2019-08-05 NOTE — Discharge Instructions (Signed)
Return if symptoms worsen or change  °

## 2019-08-05 NOTE — ED Triage Notes (Addendum)
Pt reports lower abd pain that started Monday, pt says it "feels the same as when she had PID last year". Pt also has nausea as well. Pt reports "spotting blood" for the past week. Last period was the end of February. Pt denies being pregnant. Reports dysuria.

## 2019-08-06 ENCOUNTER — Emergency Department (HOSPITAL_COMMUNITY)
Admission: EM | Admit: 2019-08-06 | Discharge: 2019-08-06 | Disposition: A | Discharge status: Home / Self Care | Payer: 59 | Attending: Emergency Medicine | Admitting: Emergency Medicine

## 2019-08-06 ENCOUNTER — Encounter (HOSPITAL_COMMUNITY): Payer: Self-pay | Admitting: *Deleted

## 2019-08-06 ENCOUNTER — Other Ambulatory Visit: Payer: Self-pay

## 2019-08-06 ENCOUNTER — Emergency Department (HOSPITAL_COMMUNITY): Payer: 59

## 2019-08-06 DIAGNOSIS — I10 Essential (primary) hypertension: Secondary | ICD-10-CM | POA: Insufficient documentation

## 2019-08-06 DIAGNOSIS — R109 Unspecified abdominal pain: Secondary | ICD-10-CM | POA: Diagnosis present

## 2019-08-06 DIAGNOSIS — N72 Inflammatory disease of cervix uteri: Secondary | ICD-10-CM | POA: Diagnosis not present

## 2019-08-06 DIAGNOSIS — R1030 Lower abdominal pain, unspecified: Secondary | ICD-10-CM | POA: Diagnosis not present

## 2019-08-06 DIAGNOSIS — Z79899 Other long term (current) drug therapy: Secondary | ICD-10-CM | POA: Insufficient documentation

## 2019-08-06 LAB — COMPREHENSIVE METABOLIC PANEL
ALT: 46 U/L — ABNORMAL HIGH (ref 0–44)
AST: 31 U/L (ref 15–41)
Albumin: 4.5 g/dL (ref 3.5–5.0)
Alkaline Phosphatase: 75 U/L (ref 38–126)
Anion gap: 7 (ref 5–15)
BUN: 18 mg/dL (ref 6–20)
CO2: 25 mmol/L (ref 22–32)
Calcium: 9 mg/dL (ref 8.9–10.3)
Chloride: 105 mmol/L (ref 98–111)
Creatinine, Ser: 0.89 mg/dL (ref 0.44–1.00)
GFR calc Af Amer: >60 mL/min (ref >60)
GFR calc non Af Amer: >60 mL/min (ref >60)
Glucose, Bld: 92 mg/dL (ref 70–99)
Potassium: 4.4 mmol/L (ref 3.5–5.1)
Sodium: 137 mmol/L (ref 135–145)
Total Bilirubin: 0.6 mg/dL (ref 0.3–1.2)
Total Protein: 8 g/dL (ref 6.5–8.1)

## 2019-08-06 LAB — CBC WITH DIFFERENTIAL/PLATELET
Abs Immature Granulocytes: 0.01 10*3/uL (ref 0.00–0.07)
Basophils Absolute: 0 10*3/uL (ref 0.0–0.1)
Basophils Relative: 1 %
Eosinophils Absolute: 0.1 10*3/uL (ref 0.0–0.5)
Eosinophils Relative: 3 %
HCT: 44.8 % (ref 36.0–46.0)
Hemoglobin: 13.6 g/dL (ref 12.0–15.0)
Immature Granulocytes: 0 %
Lymphocytes Relative: 44 %
Lymphs Abs: 1.8 10*3/uL (ref 0.7–4.0)
MCH: 28.1 pg (ref 26.0–34.0)
MCHC: 30.4 g/dL (ref 30.0–36.0)
MCV: 92.6 fL (ref 80.0–100.0)
Monocytes Absolute: 0.3 10*3/uL (ref 0.1–1.0)
Monocytes Relative: 6 %
Neutro Abs: 1.9 10*3/uL (ref 1.7–7.7)
Neutrophils Relative %: 46 %
Platelets: 208 10*3/uL (ref 150–400)
RBC: 4.84 MIL/uL (ref 3.87–5.11)
RDW: 12.5 % (ref 11.5–15.5)
WBC: 4.2 10*3/uL (ref 4.0–10.5)
nRBC: 0 % (ref 0.0–0.2)

## 2019-08-06 MED ORDER — HYDROCODONE-ACETAMINOPHEN 5-325 MG PO TABS
1.0000 | ORAL_TABLET | Freq: Once | ORAL | Status: AC
  Administered 2019-08-06: 20:00:00 1 via ORAL
  Filled 2019-08-06: qty 1

## 2019-08-06 MED ORDER — KETOROLAC TROMETHAMINE 30 MG/ML IJ SOLN
15.0000 mg | Freq: Once | INTRAMUSCULAR | Status: AC
  Administered 2019-08-06: 20:00:00 15 mg via INTRAVENOUS
  Filled 2019-08-06: qty 1

## 2019-08-06 MED ORDER — HYDROCODONE-ACETAMINOPHEN 5-325 MG PO TABS: 1.0000 | ORAL_TABLET | ORAL | 0 refills | Status: DC | PRN

## 2019-08-06 MED ORDER — NAPROXEN 500 MG PO TABS: 500.0000 mg | ORAL_TABLET | Freq: Two times a day (BID) | ORAL | 0 refills | Status: DC

## 2019-08-06 MED ORDER — MORPHINE SULFATE (PF) 4 MG/ML IV SOLN
4.0000 mg | Freq: Once | INTRAVENOUS | Status: AC
  Administered 2019-08-06: 19:00:00 4 mg via INTRAVENOUS
  Filled 2019-08-06: qty 1

## 2019-08-06 MED ORDER — IOHEXOL 300 MG/ML  SOLN
100.0000 mL | Freq: Once | INTRAMUSCULAR | Status: AC | PRN
  Administered 2019-08-06: 19:00:00 100 mL via INTRAVENOUS

## 2019-08-06 MED ORDER — ONDANSETRON HCL 4 MG/2ML IJ SOLN
4.0000 mg | Freq: Once | INTRAMUSCULAR | Status: AC
  Administered 2019-08-06: 4 mg via INTRAVENOUS
  Filled 2019-08-06: qty 2

## 2019-08-06 NOTE — ED Provider Notes (Signed)
[facility] EMERGENCY DEPARTMENT Provider Note   CSN: [id number] Arrival date & time: 08/06/19  1635     History Chief Complaint  Patient presents with  . Abdominal Pain    Elaine Hunt is a 24 y.o. female with a history significant for HTN Gerd and asthma presenting with lower abdominal pain described as sharp, intermittent and worsened since she was seen here last night and diagnosed with cervicitis.  She describes constant pain, worsened with movement and palpation and radiates into her right lower pelvic region.  She was treated with rocephin yesterday and endorses starting the doxycycline prescribed. Her gc/chlamydia cultures have not resulted yet.  She denies fevers,chills, nausea or vomiting but does report reduced appetite.  She has had no dysuria, diarrhea or constipation.  Last bm was this am which did not affect her pain. She has found no alleviators for her pain.   The history is provided by the patient.       Past Medical History:  Diagnosis Date  . Allergy   . Asthma   . Chronic knee pain   . Constipation   . GERD (gastroesophageal reflux disease)   . Heart murmur   . Hypertension     Patient Active Problem List   Diagnosis Date Noted  . Normal weight, pediatric, BMI 5th to 84th percentile for age 66/19/2016  . Asthma, chronic 03/23/2013  . Acne 03/23/2013  . Knee pain, bilateral 03/23/2013  . Unspecified constipation 03/23/2013    History reviewed. No pertinent surgical history.   OB History    Gravida  0   Para  0   Term  0   Preterm  0   AB  0   Living  0     SAB  0   TAB  0   Ectopic  0   Multiple  0   Live Births              Family History  Problem Relation Age of Onset  . Heart failure Mother   . Hypertension Mother   . Arthritis Mother   . Asthma Mother   . COPD Mother   . Depression Mother   . Hyperlipidemia Mother   . Kidney disease Mother   . Anxiety disorder Mother   . Panic disorder Mother   . Cancer Father         brain tumor  . Stroke Maternal Grandmother   . Heart disease Maternal Grandmother   . Marfan syndrome Maternal Grandmother   . Hyperlipidemia Maternal Grandmother   . Hypertension Maternal Uncle   . Heart failure Other   . Stroke Other     Social History   Tobacco Use  . Smoking status: Never Smoker  . Smokeless tobacco: Never Used  Substance Use Topics  . Alcohol use: No    Alcohol/week: 0.0 standard drinks  . Drug use: No    Home Medications Prior to Admission medications   Medication Sig Start Date End Date Taking? Authorizing Provider  doxycycline (VIBRA-TABS) 100 MG tablet Take 1 tablet (100 mg total) by mouth 2 (two) times daily. 08/05/19  Yes Elson Areas, PA-C  HYDROcodone-acetaminophen (NORCO/VICODIN) 5-325 MG tablet Take 1 tablet by mouth every 4 (four) hours as needed. 08/06/19   Burgess Amor, PA-C  naproxen (NAPROSYN) 500 MG tablet Take 1 tablet (500 mg total) by mouth 2 (two) times daily. 08/06/19   Burgess Amor, PA-C    Allergies    Ibuprofen and Penicillins  Review of Systems   Review of Systems  Constitutional: Negative for chills and fever.  HENT: Negative for congestion and sore throat.   Eyes: Negative.   Respiratory: Negative for chest tightness and shortness of breath.   Cardiovascular: Negative for chest pain.  Gastrointestinal: Negative for abdominal pain, constipation, diarrhea, nausea and vomiting.  Genitourinary: Positive for pelvic pain and vaginal discharge. Negative for dysuria.  Musculoskeletal: Negative for arthralgias, joint swelling and neck pain.  Skin: Negative.  Negative for rash and wound.  Neurological: Negative for dizziness, weakness, light-headedness, numbness and headaches.  Psychiatric/Behavioral: Negative.   All other systems reviewed and are negative.   Physical Exam Updated Vital Signs BP 139/81   Pulse 92   Temp 98.4 F (36.9 C) (Oral)   Resp 16   Ht 5\' 1"  (1.549 m)   Wt 76 kg   SpO2 98%   BMI 31.66 kg/m    Physical Exam Vitals and nursing note reviewed.  Constitutional:      Appearance: She is well-developed.  HENT:     Head: Normocephalic and atraumatic.  Eyes:     Conjunctiva/sclera: Conjunctivae normal.  Cardiovascular:     Rate and Rhythm: Normal rate and regular rhythm.     Heart sounds: Normal heart sounds.  Pulmonary:     Effort: Pulmonary effort is normal.     Breath sounds: Normal breath sounds. No wheezing.  Abdominal:     General: Bowel sounds are normal.     Palpations: Abdomen is soft.     Tenderness: There is abdominal tenderness in the right lower quadrant and suprapubic area. There is no right CVA tenderness, left CVA tenderness, guarding or rebound. Positive signs include psoas sign.     Comments: ttp suprapubic and RLQ.  No guarding.    Genitourinary:    Comments: Deferred. Labs and exam from last nights visit reviewed. Musculoskeletal:        General: Normal range of motion.     Cervical back: Normal range of motion.  Skin:    General: Skin is warm and dry.  Neurological:     Mental Status: She is alert.     ED Results / Procedures / Treatments   Labs (all labs ordered are listed, but only abnormal results are displayed) Labs Reviewed  COMPREHENSIVE METABOLIC PANEL - Abnormal; Notable for the following components:      Result Value   ALT 46 (*)    All other components within normal limits  CBC WITH DIFFERENTIAL/PLATELET    EKG None  Radiology CT ABDOMEN PELVIS W CONTRAST  Result Date: 08/06/2019 CLINICAL DATA:  Increasing suprapubic and right lower quadrant pain. EXAM: CT ABDOMEN AND PELVIS WITH CONTRAST TECHNIQUE: Multidetector CT imaging of the abdomen and pelvis was performed using the standard protocol following bolus administration of intravenous contrast. CONTRAST:  170mL OMNIPAQUE IOHEXOL 300 MG/ML  SOLN COMPARISON:  October 14, 2016 FINDINGS: Lower chest: No acute abnormality. Hepatobiliary: No focal liver abnormality is seen. No gallstones,  gallbladder wall thickening, or biliary dilatation. Pancreas: Unremarkable. No pancreatic ductal dilatation or surrounding inflammatory changes. Spleen: Normal in size without focal abnormality. Adrenals/Urinary Tract: Adrenal glands are unremarkable. Kidneys are normal, without renal calculi, focal lesion, or hydronephrosis. Bladder is unremarkable. Stomach/Bowel: Stomach is within normal limits. Appendix appears normal. No evidence of bowel wall thickening, distention, or inflammatory changes. Vascular/Lymphatic: No significant vascular findings are present. No enlarged abdominal or pelvic lymph nodes. Reproductive: Uterus and bilateral adnexa are unremarkable. Other: No abdominal wall hernia  or abnormality. No abdominopelvic ascites. Musculoskeletal: No acute or significant osseous findings. IMPRESSION: No evidence of acute abnormalities within the abdomen or pelvis. Electronically Signed   By: Ted Mcalpine M.D.   On: 08/06/2019 19:17    Procedures Procedures (including critical care time)  Medications Ordered in ED Medications  morphine 4 MG/ML injection 4 mg (4 mg Intravenous Given 08/06/19 1915)  ondansetron (ZOFRAN) injection 4 mg (4 mg Intravenous Given 08/06/19 1915)  iohexol (OMNIPAQUE) 300 MG/ML solution 100 mL (100 mLs Intravenous Contrast Given 08/06/19 1900)  ketorolac (TORADOL) 30 MG/ML injection 15 mg (15 mg Intravenous Given 08/06/19 2028)  HYDROcodone-acetaminophen (NORCO/VICODIN) 5-325 MG per tablet 1 tablet (1 tablet Oral Given 08/06/19 2027)    ED Course  I have reviewed the triage vital signs and the nursing notes.  Pertinent labs & imaging results that were available during my care of the patient were reviewed by me and considered in my medical decision making (see chart for details).    MDM Rules/Calculators/A&P                     Labs and imaging reviewed and discussed with pt.  UA and u preg completed last night reviewed, not repeated tonight.  Pt with persistent and  worsened lower pelvic pain with no CT findings to suggest source.  She was diagnosed with cervicitis here last night with significant CMT and cervical discharge.  No appendicitis, no CT findings to suggest TOA or other pelvic abscess/infection.  Encouraged to continue her doxycycline, added naproxen and hydrocodone for pain relief.  Referral to [facility] for f/u care if sx persist, she is a reported pt of Dr Emelda Fear.   Final Clinical Impression(s) / ED Diagnoses Final diagnoses:  Lower abdominal pain  Cervicitis    Rx / DC Orders ED Discharge Orders         Ordered    naproxen (NAPROSYN) 500 MG tablet  2 times daily     08/06/19 2016    HYDROcodone-acetaminophen (NORCO/VICODIN) 5-325 MG tablet  Every 4 hours PRN     08/06/19 2016           Victoriano Lain 08/06/19 2136    Benjiman Core, MD 08/06/19 2258

## 2019-08-06 NOTE — Discharge Instructions (Signed)
Your CT scan today is reassuring with no evidence for appendicitis, abscess or PID (pelvic inflammatory disease) which is a complication of vaginal infections.  Make sure to complete the entire course of the antibiotic you were prescribed here yesterday.  Use the medicines prescribed for your abdominal pain, as discussed the hydrocodone will make you drowsy - do not drive within 4 hours of taking this medicine.  Make sure you take the naproxen with a snack or meal.

## 2019-08-06 NOTE — ED Triage Notes (Signed)
Patient comes to the ED with lower abdominal pain.  Patient seen in this ED one day ago for lower abdominal pain.  Patient reports pain getting worse.  Patient reports nausea without vomiting.

## 2019-08-08 LAB — GC/CHLAMYDIA PROBE AMP (~~LOC~~) NOT AT ARMC
Chlamydia: NEGATIVE
Comment: NEGATIVE
Comment: NORMAL
Neisseria Gonorrhea: NEGATIVE

## 2019-10-23 ENCOUNTER — Encounter (HOSPITAL_COMMUNITY): Payer: Self-pay

## 2019-10-23 ENCOUNTER — Emergency Department (HOSPITAL_COMMUNITY)
Admission: EM | Admit: 2019-10-23 | Discharge: 2019-10-24 | Disposition: A | Discharge status: Home / Self Care | Payer: Managed Care, Other (non HMO) | Attending: Emergency Medicine | Admitting: Emergency Medicine

## 2019-10-23 ENCOUNTER — Other Ambulatory Visit: Payer: Self-pay

## 2019-10-23 DIAGNOSIS — I10 Essential (primary) hypertension: Secondary | ICD-10-CM | POA: Insufficient documentation

## 2019-10-23 DIAGNOSIS — J45909 Unspecified asthma, uncomplicated: Secondary | ICD-10-CM | POA: Diagnosis not present

## 2019-10-23 DIAGNOSIS — R102 Pelvic and perineal pain: Secondary | ICD-10-CM | POA: Insufficient documentation

## 2019-10-23 DIAGNOSIS — Z880 Allergy status to penicillin: Secondary | ICD-10-CM | POA: Insufficient documentation

## 2019-10-23 DIAGNOSIS — T7421XA Adult sexual abuse, confirmed, initial encounter: Secondary | ICD-10-CM | POA: Diagnosis not present
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
## 2019-10-24 LAB — COMPREHENSIVE METABOLIC PANEL
ALT: 71 U/L — ABNORMAL HIGH (ref 0–44)
AST: 33 U/L (ref 15–41)
Albumin: 4.2 g/dL (ref 3.5–5.0)
Alkaline Phosphatase: 87 U/L (ref 38–126)
Anion gap: 8 (ref 5–15)
BUN: 16 mg/dL (ref 6–20)
CO2: 26 mmol/L (ref 22–32)
Calcium: 9.1 mg/dL (ref 8.9–10.3)
Chloride: 104 mmol/L (ref 98–111)
Creatinine, Ser: 0.76 mg/dL (ref 0.44–1.00)
GFR calc Af Amer: >60 mL/min (ref >60)
GFR calc non Af Amer: >60 mL/min (ref >60)
Glucose, Bld: 97 mg/dL (ref 70–99)
Potassium: 3.6 mmol/L (ref 3.5–5.1)
Sodium: 138 mmol/L (ref 135–145)
Total Bilirubin: 0.5 mg/dL (ref 0.3–1.2)
Total Protein: 7.7 g/dL (ref 6.5–8.1)

## 2019-10-24 LAB — RAPID HIV SCREEN (HIV 1/2 AB+AG)
HIV 1/2 Antibodies: NONREACTIVE
HIV-1 P24 Antigen - HIV24: NONREACTIVE

## 2019-10-24 LAB — POC URINE PREG, ED: Preg Test, Ur: NEGATIVE

## 2019-10-24 LAB — HEPATITIS B SURFACE ANTIGEN: Hepatitis B Surface Ag: NONREACTIVE

## 2019-10-24 LAB — HEPATITIS C ANTIBODY: HCV Ab: NONREACTIVE

## 2019-10-24 LAB — RPR: RPR Ser Ql: NONREACTIVE

## 2019-10-24 MED ORDER — AZITHROMYCIN 250 MG PO TABS
1000.0000 mg | ORAL_TABLET | Freq: Once | ORAL | Status: AC
  Administered 2019-10-24: 1000 mg via ORAL
  Filled 2019-10-24: qty 4

## 2019-10-24 MED ORDER — ELVITEG-COBIC-EMTRICIT-TENOFAF 150-150-200-10 MG PO TABS: 1.0000 | ORAL_TABLET | Freq: Every day | ORAL | 0 refills | Status: DC

## 2019-10-24 MED ORDER — ELVITEG-COBIC-EMTRICIT-TENOFAF 150-150-200-10 MG PREPACK
5.0000 | ORAL_TABLET | Freq: Once | ORAL | Status: AC
  Administered 2019-10-24: 5 via ORAL
  Filled 2019-10-24: qty 5

## 2019-10-24 MED FILL — GENVOYA TABLET: 150-150-200 | 30 days supply | Qty: 30 | Fill #0
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
## 2019-10-24 NOTE — SANE Note (Signed)
Follow-up Phone Call  Patient gives verbal consent for a FNE/SANE follow-up phone call in 48-72 hours: No Patient's telephone number: [phone] Patient gives verbal consent to leave voicemail at the phone number listed above: No DO NOT CALL between the hours of: Do not call. Patient states, "I'll call if I have any questions."

## 2019-10-24 NOTE — SANE Note (Signed)
The SANE/FNE (Forensic Nurse Examiner) consult has been completed. The primary RN and/or provider have been notified. Please contact the SANE/FNE nurse on call (listed in Amion) with any further concerns.  

## 2019-10-24 NOTE — ED Notes (Signed)
Soil scientist at bedside
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
## 2019-10-24 NOTE — SANE Note (Signed)
[employer] Info  RxBIN [id number]  RxPCN ACCESS  RxGRP [id number]  ISSUER [phone])  ID [id number]

## 2019-10-24 NOTE — SANE Note (Signed)
Advancing Access Information  Letter of Medical Necessity Information was entered into Epic.   Patient also signed the Advancing Access Enrollment Form in Epic.  A copy of her insurance card was not available, card information was obtained from registration.   A copy of her co-pay cared and prescription were faxed to [facility]. [facility] was also emailed with the information.   The following information was collected for the Letter of Medical Necessity:  Elaine Hunt February 01, 1996  [address] [place] [address]  [phone]  $75,000 per year of household income 2 person home  [facility]  Group #[id number] Member ID # [id number] Effective Date 06/06/2019 [address] [place], [place] [address] [phone]  Patient prescribed Elaine Hunt

## 2020-05-26 ENCOUNTER — Ambulatory Visit
Admission: EM | Admit: 2020-05-26 | Discharge: 2020-05-26 | Disposition: A | Discharge status: Home / Self Care | Payer: Managed Care, Other (non HMO) | Attending: Family Medicine | Admitting: Family Medicine

## 2020-05-26 ENCOUNTER — Encounter: Payer: Self-pay | Admitting: Emergency Medicine

## 2020-05-26 ENCOUNTER — Other Ambulatory Visit: Payer: Self-pay

## 2020-05-26 DIAGNOSIS — S161XXA Strain of muscle, fascia and tendon at neck level, initial encounter: Secondary | ICD-10-CM | POA: Diagnosis not present

## 2020-05-26 DIAGNOSIS — M542 Cervicalgia: Secondary | ICD-10-CM

## 2020-05-26 MED ORDER — CYCLOBENZAPRINE HCL 5 MG PO TABS: 5.0000 mg | ORAL_TABLET | Freq: Three times a day (TID) | ORAL | 0 refills | Status: DC | PRN

## 2020-05-26 NOTE — ED Provider Notes (Signed)
[facility]   [id number] 05/26/20 Arrival Time: 1418  AO:ZHYQM PAIN  SUBJECTIVE: History from: patient. Elaine Hunt is a 25 y.o. female complains of bilateral neck pain and headaches intermittently for the last month.  Denies a precipitating event or specific injury.  Localizes the pain to the base of the skull bilaterally. Describes the pain as  intermittent and achy in character. Has tried OTC medications without relief. Symptoms are made worse with activity.  Denies similar symptoms in the past. Denies fever, chills, erythema, ecchymosis, effusion, weakness, numbness and tingling, saddle paresthesias, loss of bowel or bladder function.      ROS: As per HPI.  All other pertinent ROS negative.     Past Medical History:  Diagnosis Date  . Allergy   . Asthma   . Chronic knee pain   . Constipation   . GERD (gastroesophageal reflux disease)   . Heart murmur   . Hypertension    History reviewed. No pertinent surgical history. Allergies  Allergen Reactions  . Ibuprofen Nausea Only  . Penicillins Rash   No current facility-administered medications on file prior to encounter.   No current outpatient medications on file prior to encounter.   Social History   Socioeconomic History  . Marital status: Single    Spouse name: Not on file  . Number of children: Not on file  . Years of education: Not on file  . Highest education level: Not on file  Occupational History  . Not on file  Tobacco Use  . Smoking status: Never Smoker  . Smokeless tobacco: Never Used  Vaping Use  . Vaping Use: Never used  Substance and Sexual Activity  . Alcohol use: Yes    Alcohol/week: 6.0 standard drinks    Types: 3 Standard drinks or equivalent, 3 Shots of liquor per week  . Drug use: No  . Sexual activity: Yes  Other Topics Concern  . Not on file  Social History Narrative   Mom with lots of health problems, recently dx with Stage III kidney disease. May be causing anxiety.   Only  child. Good family support from extended family   Sr in HS, wants to study computer science in college at [employer] or [employer] next year.   Social Determinants of Health   Financial Resource Strain: Not on file  Food Insecurity: Not on file  Transportation Needs: Not on file  Physical Activity: Not on file  Stress: Not on file  Social Connections: Not on file  Intimate Partner Violence: Not on file   Family History  Problem Relation Age of Onset  . Heart failure Mother   . Hypertension Mother   . Arthritis Mother   . Asthma Mother   . COPD Mother   . Depression Mother   . Hyperlipidemia Mother   . Kidney disease Mother   . Anxiety disorder Mother   . Panic disorder Mother   . Cancer Father        brain tumor  . Stroke Maternal Grandmother   . Heart disease Maternal Grandmother   . Marfan syndrome Maternal Grandmother   . Hyperlipidemia Maternal Grandmother   . Hypertension Maternal Uncle   . Heart failure Other   . Stroke Other     OBJECTIVE:  Vitals:   05/26/20 1438 05/26/20 1439  BP: 140/87   Pulse: (!) 133   Resp: 16   Temp: 98.2 F (36.8 C)   TempSrc: Oral   SpO2: 98%   Weight:  173 lb (78.5 kg)  Height:  5' 1.5" (1.562 m)    General appearance: ALERT; in no acute distress.  Head: NCAT Lungs: Normal respiratory effort CV: pulses 2+ bilaterally. Cap refill < 2 seconds Musculoskeletal:  Inspection: Skin warm, dry, clear and intact No erythema, effusion to posterior neck Palpation: Bilateral posterior neck tender to palpation ROM: Limited ROM active and passive  Skin: warm and dry Neurologic: Ambulates without difficulty; Sensation intact about the upper/ lower extremities Psychological: alert and cooperative; normal mood and affect  DIAGNOSTIC STUDIES:  No results found.   ASSESSMENT & PLAN:  1. Acute strain of neck muscle, initial encounter   2. Neck pain     Meds ordered this encounter  Medications  . DISCONTD: cyclobenzaprine (FLEXERIL) 5  MG tablet    Sig: Take 1 tablet (5 mg total) by mouth 3 (three) times daily as needed for muscle spasms.    Dispense:  30 tablet    Refill:  0    Order Specific Question:   Supervising Provider    Answer:   Merrilee Jansky [id number]  . cyclobenzaprine (FLEXERIL) 5 MG tablet    Sig: Take 1 tablet (5 mg total) by mouth 3 (three) times daily as needed for muscle spasms.    Dispense:  30 tablet    Refill:  0    Order Specific Question:   Supervising Provider    Answer:   Merrilee Jansky [id number]   Prescribed cyclobenzaprine Continue conservative management of rest, ice, and gentle stretches Take ibuprofen as needed for pain relief (may cause abdominal discomfort, ulcers, and GI bleeds avoid taking with other NSAIDs) Take cyclobenzaprine at nighttime for symptomatic relief. Avoid driving or operating heavy machinery while using medication. Follow up with PCP if symptoms persist Return or go to the ER if you have any new or worsening symptoms (fever, chills, chest pain, abdominal pain, changes in bowel or bladder habits, pain radiating into lower legs)   Reviewed expectations re: course of current medical issues. Questions answered. Outlined signs and symptoms indicating need for more acute intervention. Patient verbalized understanding. After Visit Summary given.       Moshe Cipro, NP 05/28/20 1046

## 2020-05-26 NOTE — ED Triage Notes (Signed)
Has been having pain at the back of her neck that shoots up to her head.  "almost like a tension headache" x 1 month.

## 2020-05-26 NOTE — Discharge Instructions (Signed)
I have sent in flexeril for you to take twice a day as needed for muscle spasms. This medication can make you sleepy. Do not drive or operate heavy machinery with this medication.  Follow up with primary care and try to get in with physical therapy.  Follow up with this office or with primary care if symptoms are persisting.  Follow up in the ER for high fever, trouble swallowing, trouble breathing, other concerning symptoms.

## 2020-06-25 ENCOUNTER — Ambulatory Visit: Payer: Managed Care, Other (non HMO) | Admitting: Internal Medicine

## 2021-01-20 ENCOUNTER — Other Ambulatory Visit: Payer: Self-pay

## 2021-01-20 ENCOUNTER — Encounter (HOSPITAL_COMMUNITY): Payer: Self-pay | Admitting: *Deleted

## 2021-01-20 ENCOUNTER — Emergency Department (HOSPITAL_COMMUNITY)
Admission: EM | Admit: 2021-01-20 | Discharge: 2021-01-20 | Disposition: A | Discharge status: Home / Self Care | Payer: Managed Care, Other (non HMO) | Attending: Emergency Medicine | Admitting: Emergency Medicine

## 2021-01-20 DIAGNOSIS — J45909 Unspecified asthma, uncomplicated: Secondary | ICD-10-CM | POA: Insufficient documentation

## 2021-01-20 DIAGNOSIS — I10 Essential (primary) hypertension: Secondary | ICD-10-CM | POA: Diagnosis not present

## 2021-01-20 DIAGNOSIS — Y9239 Other specified sports and athletic area as the place of occurrence of the external cause: Secondary | ICD-10-CM | POA: Diagnosis not present

## 2021-01-20 DIAGNOSIS — W228XXA Striking against or struck by other objects, initial encounter: Secondary | ICD-10-CM | POA: Diagnosis not present

## 2021-01-20 DIAGNOSIS — Y9389 Activity, other specified: Secondary | ICD-10-CM | POA: Insufficient documentation

## 2021-01-20 DIAGNOSIS — S0990XA Unspecified injury of head, initial encounter: Secondary | ICD-10-CM | POA: Diagnosis present

## 2021-01-20 NOTE — Discharge Instructions (Signed)
Likely you are suffering from a concussion, I recommend brain rest i.e. decreasing mental stimulation avoid reading, screen time, advanced problem-solving, exercising as this can exacerbate concussion-like symptoms.  I would slowly reintroduce them as tolerated.  Recommend over-the-counter pain medications as needed.  Please follow-up with the concussion clinic for further evaluation.  Come back to the emergency department if you develop chest pain, shortness of breath, severe abdominal pain, uncontrolled nausea, vomiting, diarrhea.

## 2021-01-20 NOTE — ED Triage Notes (Signed)
Pt was at the gym and was squatting and came back up hitting her head on a metal bar in the gym.  Denies any LOC, but states she has light sensitivity and headache.  Denies any blood thinners.

## 2021-01-20 NOTE — ED Provider Notes (Signed)
[facility] EMERGENCY DEPARTMENT Provider Note   CSN: [id number] Arrival date & time: 01/20/21  1259     History Chief Complaint  Patient presents with   Head Injury    Elaine Hunt is a 24 y.o. female.  HPI  Patient with no significant medical history presents to the emergency department with chief complaint of a head injury.  She states 2 days ago she was at the gym squatting and she lifted her head up hitting the top of her head on the bar.  She states that she had a slight headache at that time this had resolved but unfortunate came back yesterday.  She now has a slight headache and photophobia.  She denies change in vision, paresthesias or weakness in the upper/ lower extremities, she denies lightheadedness, dizziness, feeling off balance, nausea or vomiting, she is not on anticoagulant.  Patient states she is taking some Tylenol which has not really helped with her pain.  She has no other complaints.  She does not endorse neck, back pain, chest pain, abdominal pain, worsening pedal edema. Past Medical History:  Diagnosis Date   Allergy    Asthma    Chronic knee pain    Constipation    GERD (gastroesophageal reflux disease)    Heart murmur    Hypertension     Patient Active Problem List   Diagnosis Date Noted   Normal weight, pediatric, BMI 5th to 84th percentile for age 43/19/2016   Asthma, chronic 03/23/2013   Acne 03/23/2013   Knee pain, bilateral 03/23/2013   Unspecified constipation 03/23/2013    History reviewed. No pertinent surgical history.   OB History     Gravida  0   Para  0   Term  0   Preterm  0   AB  0   Living  0      SAB  0   IAB  0   Ectopic  0   Multiple  0   Live Births              Family History  Problem Relation Age of Onset   Heart failure Mother    Hypertension Mother    Arthritis Mother    Asthma Mother    COPD Mother    Depression Mother    Hyperlipidemia Mother    Kidney disease Mother    Anxiety  disorder Mother    Panic disorder Mother    Cancer Father        brain tumor   Stroke Maternal Grandmother    Heart disease Maternal Grandmother    Marfan syndrome Maternal Grandmother    Hyperlipidemia Maternal Grandmother    Hypertension Maternal Uncle    Heart failure Other    Stroke Other     Social History   Tobacco Use   Smoking status: Never   Smokeless tobacco: Never  Vaping Use   Vaping Use: Never used  Substance Use Topics   Alcohol use: Yes    Alcohol/week: 6.0 standard drinks    Types: 3 Standard drinks or equivalent, 3 Shots of liquor per week   Drug use: No    Home Medications Prior to Admission medications   Medication Sig Start Date End Date Taking? Authorizing Provider  cyclobenzaprine (FLEXERIL) 5 MG tablet Take 1 tablet (5 mg total) by mouth 3 (three) times daily as needed for muscle spasms. Patient not taking: Reported on 01/20/2021 05/26/20   Moshe Cipro, NP    Allergies  Ibuprofen and Penicillins  Review of Systems   Review of Systems  Constitutional:  Negative for chills and fever.  HENT:  Negative for congestion.   Eyes:  Positive for visual disturbance.  Respiratory:  Negative for shortness of breath.   Cardiovascular:  Negative for chest pain.  Gastrointestinal:  Negative for abdominal pain.  Genitourinary:  Negative for enuresis.  Musculoskeletal:  Negative for back pain.  Skin:  Negative for rash.  Neurological:  Positive for headaches. Negative for dizziness.  Hematological:  Does not bruise/bleed easily.   Physical Exam Updated Vital Signs BP (!) 154/91 (BP Location: Right Arm)   Pulse (!) 112   Temp 98.2 F (36.8 C) (Oral)   Resp 14   Ht 5' 1.5" (1.562 m)   Wt 59.1 kg   LMP 01/19/2021   SpO2 100%   BMI 24.22 kg/m   Physical Exam Vitals and nursing note reviewed.  Constitutional:      General: She is not in acute distress.    Appearance: She is not ill-appearing.  HENT:     Head: Normocephalic and atraumatic.      Comments: No deformities of the head present, no raccoon eyes or battle sign present, head was slightly tender on the top of the head without deformities.    Nose: No congestion.  Eyes:     Extraocular Movements: Extraocular movements intact.     Conjunctiva/sclera: Conjunctivae normal.     Pupils: Pupils are equal, round, and reactive to light.  Cardiovascular:     Rate and Rhythm: Normal rate and regular rhythm.     Pulses: Normal pulses.     Heart sounds: No murmur heard.   No friction rub. No gallop.  Pulmonary:     Effort: No respiratory distress.     Breath sounds: No wheezing, rhonchi or rales.  Musculoskeletal:        General: Normal range of motion.     Cervical back: No tenderness.     Comments: Patient is moving all 4 extremities without difficulty, patient has 5 of 5 strength, neurovascular intact.  Spine was palpated was nontender to palpation.  Skin:    General: Skin is warm and dry.  Neurological:     Mental Status: She is alert.     GCS: GCS eye subscore is 4. GCS verbal subscore is 5. GCS motor subscore is 6.     Cranial Nerves: No cranial nerve deficit.     Motor: No weakness.     Coordination: Romberg sign negative. Finger-Nose-Finger Test normal.     Comments: Cranial nerves II through XII are grossly intact, no difficulty word finding, no slurring of words, able to follow two-step commands, no unilateral weakness present.  Psychiatric:        Mood and Affect: Mood normal.    ED Results / Procedures / Treatments   Labs (all labs ordered are listed, but only abnormal results are displayed) Labs Reviewed - No data to display  EKG None  Radiology No results found.  Procedures Procedures   Medications Ordered in ED Medications - No data to display  ED Course  I have reviewed the triage vital signs and the nursing notes.  Pertinent labs & imaging results that were available during my care of the patient were reviewed by me and considered in my  medical decision making (see chart for details).    MDM Rules/Calculators/A&P  Initial impression-patient presents after a head injury.  She is alert, does not appear in acute distress, vital signs reassuring.  Work-up-due to well-appearing patient, benign physical exam, further lab work and imaging are not want at this time.  Rule out-low suspicion for intracranial head bleed and/or CVA she has no neurodeficits present on exam, presentation atypical of etiology as she had head trauma 2 days ago without any focal deficits present making head bleed unlikely at this time.  Will defer on CT imaging.  Low suspicion for skull fracture as there is no gross deformities present on exam, no crepitus or deformities noted.  Low suspicion for spinal cord abnormality or spinal fracture spine was palpated is nontender to palpation, she is moving all 4 extremities out difficulty.  Plan-  Headache-likely patient is suffering from concussion, will recommend brain rest, over-the-counter pain medications, follow-up with concussion clinic for further evaluation.  Vital signs have remained stable, no indication for hospital admission.  Patient given at home care as well strict return precautions.  Patient verbalized that they understood agreed to said plan.  Final Clinical Impression(s) / ED Diagnoses Final diagnoses:  Injury of head, initial encounter    Rx / DC Orders ED Discharge Orders     None        Carroll Sage, PA-C 01/20/21 1415    Linwood Dibbles, MD 01/21/21 [phone]

## 2021-05-10 ENCOUNTER — Encounter (INDEPENDENT_AMBULATORY_CARE_PROVIDER_SITE_OTHER): Payer: Self-pay | Admitting: Family

## 2021-05-10 ENCOUNTER — Ambulatory Visit (INDEPENDENT_AMBULATORY_CARE_PROVIDER_SITE_OTHER): Payer: Commercial Managed Care - POS | Admitting: Family

## 2021-05-10 VITALS — BP 138/84 | HR 110 | Temp 98.8°F | Resp 15 | Ht 61.5 in | Wt 133.0 lb

## 2021-05-10 DIAGNOSIS — J029 Acute pharyngitis, unspecified: Secondary | ICD-10-CM

## 2021-05-10 DIAGNOSIS — U071 COVID-19: Secondary | ICD-10-CM

## 2021-05-10 DIAGNOSIS — R6889 Other general symptoms and signs: Secondary | ICD-10-CM

## 2021-05-10 DIAGNOSIS — R509 Fever, unspecified: Secondary | ICD-10-CM

## 2021-05-10 DIAGNOSIS — Z20822 Contact with and (suspected) exposure to covid-19: Secondary | ICD-10-CM

## 2021-05-10 LAB — POCT INFLUENZA A/B
POCT Rapid Influenza A AG: NEGATIVE
POCT Rapid Influenza B AG: NEGATIVE

## 2021-05-10 LAB — IN OFFICE COVID POCT ABBOTT ID NOW: SARS CoV 2 Overall Result: POSITIVE — AB

## 2021-05-10 MED ORDER — PAXLOVID (300/100) 20 X 150 MG & 10 X 100MG PO TBPK
3.0000 | ORAL_TABLET | Freq: Two times a day (BID) | ORAL | 0 refills | Status: AC
Start: 2021-05-10 — End: 2021-05-15

## 2021-05-10 NOTE — Progress Notes (Signed)
Urgent Care Provider Note    Patient: Kerri Downs   Date: 05/10/2021   MRN: [id number]       Subjective     Chief Complaint   Patient presents with    Flu like symptoms     Since this morning sore throat, headache, fever (102).  Tylenol at 6am provided adequate relief.       HPI    Kerri Downs is a 26 y.o. femalewith hx of tachycardia ( worked up by cardiologist), low WBC and mild asthma presenting symptoms concerning for COVID-19, including sore throat, fever, headache, and myalgias. Patient denies wheezing, cough, congestion, SOB, and chest pain. Symptoms began one day ago.COVID vax x 2.    High risk for serious illness: Yes    Pertinent Past Medical, Surgical, Family and Social History were reviewed.      Current Outpatient Medications:     nirmatrelvir-ritonavir 20 x 150 MG & 10 x 100MG  dose pack(emergency use authorization), Take 3 tablets by mouth 2 (two) times daily for 5 days The dosage for PAXLOVID is 300 mg nirmatrelvir (two 150 mg tablets) with 100 mg ritonavir (one 100 mg tablet) with all three tablets taken together., Disp: 30 tablet, Rfl: 0    Allergies   Allergen Reactions    Ibuprofen        Medications and Allergies reviewed.         Objective     Vitals:    05/10/21 0926   BP: 138/84   Pulse: (!) 126   Resp: 15   Temp: 98.8 F (37.1 C)   SpO2: 98%     Body mass index is 24.72 kg/m.    Physical Exam    General: no acute distress, well developed, well nourished.    HEENT: mucous membranes moist    Heart: regular rate and rhythm    Lungs: clear to auscultation bilaterally, no respiratory distress    Skin: warm, dry, no rashes on exposed skin    UCC COURSE  LABS  The following POCT tests were ordered, reviewed and discussed with the patient/family.     Results       Procedure Component Value Units Date/Time    Influenza A/B [[id number]]  (Normal) Collected: 05/10/21 0944     Updated: 05/10/21 0944     POCT QC Pass     POCT Rapid Influenza A AG Negative     POCT Rapid Influenza B AG Negative    Abbott ID  Now SARS-COV-2 POCT [[id number]]  (Abnormal) Collected: 05/10/21 0942    Specimen: Nasal Swab COVID-19 Updated: 05/10/21 0942     SARS CoV 2 Overall Result Positive            There were no x-rays reviewed with this patient during the visit.    No current facility-administered medications for this visit.               Procedures    MDM:  MEDICAL DECISION MAKING     History, physical and labs/studies most consistent with COVID 19     Chart Review:  Prior PCP, Specialist and/or ED notes reviewed today: No   Prior labs/images/studies reviewed today: No   Differential Diagnosis:  Upper Respiratory Infection, Sinusitis, Pneumonia, Flu, COVID-19, Allergic Rhinitis     Assessment     Kerri Downs was seen today for flu like symptoms.    Diagnoses and all orders for this visit:    Flu-like symptoms  -     Influenza A/B  -  Abbott ID Now SARS-COV-2 POCT    COVID-19  -     nirmatrelvir-ritonavir 20 x 150 MG & 10 x 100MG  dose pack(emergency use authorization); Take 3 tablets by mouth 2 (two) times daily for 5 days The dosage for PAXLOVID is 300 mg nirmatrelvir (two 150 mg tablets) with 100 mg ritonavir (one 100 mg tablet) with all three tablets taken together.        Outpatient COVID Treatment Plan    COVID PCR positive  If PCR is positive:  Patient is high risk for progression to serious disease.    Meets age/weight criteria for Paxlovid Yes  Renal function assessment: Normal as per pt's history   Medications have been reviewed and there are not significant drug-drug interactions  Paxlovid Recommendation: Yes     Indication for use: No booster COVID shot, asthma and low WBC count   Discussed Paxlovid in detail, time line to start medication, side effects, indication for use, EUA criteria.   Patient has had no renal disease   Paxlovid education material provided  Treatment as per orders. Push fluids. Use OTC acetaminophen for fever control. Self-isolate for 5 days from symptom onset and until fever free for 24 hours without fever  reducing medication. Detailed COVID-19 home guidance provided in AVS. Return to clinic prn if these symptoms worsen or fail to improve as anticipated. ER precautions given.   Discussed results and diagnosis with patient/family.  Discussed diagnostic uncertainty, possibility of progression of illness, warning signs for worsening condition, ER precautions as well as indications for follow-up with pcp and return to urgent care clinic. Counseled follow up plans, medication use, and home/self care. All questions from patient/ family addressed. Patient/family expressed understanding of instructions.                  Plan and follow-up discussed with patient. See AVS for further documentation.

## 2021-05-10 NOTE — Patient Instructions (Signed)
Thank you for choosing [facility].     Your Covid test result is positive.      Please take following precautions:    Isolate yourself at home for the next 5 days.   Wear a mask for 5 additional days when around others.   If you are still having symptoms after five days, continue in-home isolation until you do not have a fever (without the use of fever-reducing medication) for at least 24 hours.  Rest and stay well hydrated.   Use acetaminophen (Tylenol) to help manage fever.  Reach out to your primary care provider to determine if additional follow-up may be needed.  Visit CDC website to learn more about "what to do if you are sick."      Please contact your primary care provider or visit the nearest urgent care or emergency department if you develop warning signs or having severe symptoms, including:      Trouble breathing  Persistent pain or pressure in the chest  New confusion  Inability to wake or stay awake   Bluish lips or face     If you are feeling ill and need to be seen, please contact your primary provider. If your primary care provider is not available, please visit an [facility]. Locations can be found at [email or web address]      For additional COVID-19 information, please visit: [email or web address].  For information on COVID-19 Research Opportunities, please visit: [email or web address]     Paxlovid:  Brand Names: [place]  Paxlovid    Brand Names: [place]  Paxlovid    What is this drug used for?   It is used in certain people to treat COVID-19.    What do I need to tell my doctor BEFORE I take this drug?   If you are allergic to this drug; any part of this drug; or any other drugs, foods, or substances. Tell your doctor about the allergy and what signs you had.   If you have any of these health problems: Kidney disease or liver disease.   If you take any drugs (prescription or OTC, natural products, vitamins) that must not be taken with this drug, like certain drugs that  are used for HIV, infections, or seizures. There are many drugs that must not be taken with this drug.   This is not a list of all drugs or health problems that interact with this drug.   Tell your doctor and pharmacist about all of your drugs (prescription or OTC, natural products, vitamins) and health problems. You must check to make sure that it is safe for you to take this drug with all of your drugs and health problems. Do not start, stop, or change the dose of any drug without checking with your doctor.    What are some things I need to know or do while I take this drug?   Tell all of your health care providers that you take this drug. This includes your doctors, nurses, pharmacists, and dentists.   Do not take this drug for longer than you were told by your doctor.   This drug interacts with many other drugs. The chance of this drug's side effects may be raised or how well this drug works may be lowered. The chance of the other drugs' side effects may also be raised. This may include very bad, life-threatening, or deadly side effects. Check with your doctor and pharmacist to make sure that it is safe for you to take   this drug with all of your other drugs (prescription or OTC, natural products, vitamins).   If you have HIV infection, talk with your doctor.   If you have kidney problems, you may need to take a lower dose of this drug. Your pharmacist may remove tablets from the blister card in order to make the right dose. Be sure you know what dose of this drug to take. If you have questions about your dose or how to take it, talk with your doctor or pharmacist.   After getting this drug, you must continue to isolate and do other things to control infection. Wear a mask, social distance, do not share personal items, clean and disinfect high touch surfaces, and wash hands often as you have been told by your doctor.   Birth control pills and other hormone-based birth control may not work as well to prevent  pregnancy. Use some other kind of birth control also like a condom when taking this drug.   Tell your doctor if you are pregnant, plan on getting pregnant, or are breast-feeding. You will need to talk about the benefits and risks to you and the baby.    What are some side effects that I need to call my doctor about right away?   WARNING/CAUTION: Even though it may be rare, some people may have very bad and sometimes deadly side effects when taking a drug. Tell your doctor or get medical help right away if you have any of the following signs or symptoms that may be related to a very bad side effect:   Signs of an allergic reaction, like rash; hives; itching; red, swollen, blistered, or peeling skin with or without fever; wheezing; tightness in the chest or throat; trouble breathing, swallowing, or talking; unusual hoarseness; or swelling of the mouth, face, lips, tongue, or throat.   Signs of liver problems like dark urine, feeling tired, not hungry, upset stomach or stomach pain, light-colored stools, throwing up, or yellow skin or eyes.   Signs of high blood pressure like very bad headache or dizziness, passing out, or change in eyesight.   Signs of a very bad skin reaction (Stevens-Johnson syndrome/toxic epidermal necrolysis) like red, swollen, blistered, or peeling skin (with or without fever); red or irritated eyes; or sores in the mouth, throat, nose, or eyes.    What are some other side effects of this drug?   All drugs may cause side effects. However, many people have no side effects or only have minor side effects. Call your doctor or get medical help if any of these side effects or any other side effects bother you or do not go away:   Change in taste.   Diarrhea.   Muscle pain.   These are not all of the side effects that may occur. If you have questions about side effects, call your doctor. Call your doctor for medical advice about side effects.   You may report side effects to your national health  agency.    How is this drug best taken?   Use this drug as ordered by your doctor. Read all information given to you. Follow all instructions closely.   Take with or without food.   This product comes as two drugs in separate tablets. Both drugs will need to be taken together for each dose. Be sure you know how each dose needs to be taken. Talk with the doctor if you have questions about how to take this drug.   Swallow whole.   Do not chew, break, or crush.   Use as you have been told, even if your signs get better.   It is important that you do not miss or skip a dose of this drug during treatment.    What do I do if I miss a dose?   Take a missed dose as soon as you think about it.   If it has been more than 8 hours since the missed dose, skip the missed dose and go back to your normal time.   Do not take 2 doses at the same time or extra doses.   If you are not sure what to do if you miss a dose, call your doctor.    How do I store and/or throw out this drug?   Store at room temperature in a dry place. Do not store in a bathroom.   Keep all drugs in a safe place. Keep all drugs out of the reach of children and pets.   Throw away unused or expired drugs. Do not flush down a toilet or pour down a drain unless you are told to do so. Check with your pharmacist if you have questions about the best way to throw out drugs. There may be drug take-back programs in your area.    General drug facts   If your symptoms or health problems do not get better or if they become worse, call your doctor.   Do not share your drugs with others and do not take anyone else's drugs.   Some drugs may have another patient information leaflet. If you have any questions about this drug, please talk with your doctor, nurse, pharmacist, or other health care provider.   If you think there has been an overdose, call your poison control center or get medical care right away. Be ready to tell or show what was taken, how much, and when it happened.

## 2021-08-09 ENCOUNTER — Ambulatory Visit
Admission: EM | Admit: 2021-08-09 | Discharge: 2021-08-09 | Disposition: A | Discharge status: Home / Self Care | Payer: Managed Care, Other (non HMO) | Source: Ambulatory Visit | Attending: Family Medicine | Admitting: Family Medicine

## 2021-08-09 ENCOUNTER — Ambulatory Visit (INDEPENDENT_AMBULATORY_CARE_PROVIDER_SITE_OTHER): Payer: Managed Care, Other (non HMO)

## 2021-08-09 VITALS — BP 129/77 | HR 99 | Temp 97.9°F | Resp 18

## 2021-08-09 DIAGNOSIS — S8001XA Contusion of right knee, initial encounter: Secondary | ICD-10-CM

## 2021-08-09 DIAGNOSIS — M25561 Pain in right knee: Secondary | ICD-10-CM | POA: Diagnosis not present

## 2021-08-09 NOTE — ED Triage Notes (Signed)
Fell on right knee while playing basket ball on Wednesday.  Pain and goes numb after walking.  Knee tender to the touch ?

## 2021-08-09 NOTE — ED Provider Notes (Signed)
?[place] ? ? ? ?CSN: [id number] ?Arrival date & time: 08/09/21  0846 ? ? ?  ? ?History   ?Chief Complaint ?Chief Complaint  ?Patient presents with  ? Knee Injury  ?  Fell playing basketball and knee has been hurting. - Entered by patient  ? ? ?HPI ?Elaine Hunt is a 26 y.o. female.  ? ?Presenting today with 2-day history of right anterior knee pain.  States she is able to walk on it but it becomes more painful and feels like it is going numb after she walks on it too much.  Denies skin injury, weakness, loss of range of motion, significant swelling.  Has been trying rest and elevation with minimal relief. ? ? ?Past Medical History:  ?Diagnosis Date  ? Allergy   ? Asthma   ? Chronic knee pain   ? Constipation   ? GERD (gastroesophageal reflux disease)   ? Heart murmur   ? Hypertension   ? ? ?Patient Active Problem List  ? Diagnosis Date Noted  ? Normal weight, pediatric, BMI 5th to 84th percentile for age 21/19/2016  ? Asthma, chronic 03/23/2013  ? Acne 03/23/2013  ? Knee pain, bilateral 03/23/2013  ? Unspecified constipation 03/23/2013  ? ? ?History reviewed. No pertinent surgical history. ? ?OB History   ? ? Gravida  ?0  ? Para  ?0  ? Term  ?0  ? Preterm  ?0  ? AB  ?0  ? Living  ?0  ?  ? ? SAB  ?0  ? IAB  ?0  ? Ectopic  ?0  ? Multiple  ?0  ? Live Births  ?   ?   ?  ?  ? ? ? ?Home Medications   ? ?Prior to Admission medications   ?Medication Sig Start Date End Date Taking? Authorizing Provider  ?cyclobenzaprine (FLEXERIL) 5 MG tablet Take 1 tablet (5 mg total) by mouth 3 (three) times daily as needed for muscle spasms. ?Patient not taking: Reported on 01/20/2021 05/26/20   Faustino Congress, NP  ? ? ?Family History ?Family History  ?Problem Relation Age of Onset  ? Heart failure Mother   ? Hypertension Mother   ? Arthritis Mother   ? Asthma Mother   ? COPD Mother   ? Depression Mother   ? Hyperlipidemia Mother   ? Kidney disease Mother   ? Anxiety disorder Mother   ? Panic disorder Mother   ? Cancer  Father   ?     brain tumor  ? Stroke Maternal Grandmother   ? Heart disease Maternal Grandmother   ? Marfan syndrome Maternal Grandmother   ? Hyperlipidemia Maternal Grandmother   ? Hypertension Maternal Uncle   ? Heart failure Other   ? Stroke Other   ? ? ?Social History ?Social History  ? ?Tobacco Use  ? Smoking status: Never  ? Smokeless tobacco: Never  ?Vaping Use  ? Vaping Use: Never used  ?Substance Use Topics  ? Alcohol use: Yes  ?  Alcohol/week: 6.0 standard drinks  ?  Types: 3 Standard drinks or equivalent, 3 Shots of liquor per week  ? Drug use: No  ? ? ? ?Allergies   ?Ibuprofen and Penicillins ? ? ?Review of Systems ?Review of Systems ?Per HPI ? ?Physical Exam ?Triage Vital Signs ?ED Triage Vitals  ?Enc Vitals Group  ?   BP 08/09/21 0854 129/77  ?   Pulse Rate 08/09/21 0854 99  ?   Resp 08/09/21 0854 18  ?  Temp 08/09/21 0854 97.9 ?F (36.6 ?C)  ?   Temp Source 08/09/21 0854 Oral  ?   SpO2 08/09/21 0854 100 %  ?   Weight --   ?   Height --   ?   Head Circumference --   ?   Peak Flow --   ?   Pain Score 08/09/21 0855 7  ?   Pain Loc --   ?   Pain Edu? --   ?   Excl. in [place]? --   ? ?No data found. ? ?Updated Vital Signs ?BP 129/77 (BP Location: Right Arm)   Pulse 99   Temp 97.9 ?F (36.6 ?C) (Oral)   Resp 18   LMP 07/23/2021 (Exact Date)   SpO2 100%  ? ?Visual Acuity ?Right Eye Distance:   ?Left Eye Distance:   ?Bilateral Distance:   ? ?Right Eye Near:   ?Left Eye Near:    ?Bilateral Near:    ? ?Physical Exam ?Vitals and nursing note reviewed.  ?Constitutional:   ?   Appearance: Normal appearance. She is not ill-appearing.  ?HENT:  ?   Head: Atraumatic.  ?Eyes:  ?   Extraocular Movements: Extraocular movements intact.  ?   Conjunctiva/sclera: Conjunctivae normal.  ?Cardiovascular:  ?   Rate and Rhythm: Normal rate and regular rhythm.  ?   Heart sounds: Normal heart sounds.  ?Pulmonary:  ?   Effort: Pulmonary effort is normal.  ?   Breath sounds: Normal breath sounds.  ?Musculoskeletal:     ?   General:  Tenderness and signs of injury present. No swelling or deformity. Normal range of motion.  ?   Cervical back: Normal range of motion and neck supple.  ?   Comments: Tender to palpation over anterior right knee in area of bruising.  No significant edema noted.  No joint line tenderness, joint instability.  Negative drawer testing, McMurray's and normal range of motion and gait  ?Skin: ?   General: Skin is warm and dry.  ?   Findings: Bruising present. No erythema.  ?   Comments: Minimal bruising right anterior knee  ?Neurological:  ?   Mental Status: She is alert and oriented to person, place, and time.  ?   Motor: No weakness.  ?   Gait: Gait normal.  ?   Comments: Right lower extremity neurovascularly intact  ?Psychiatric:     ?   Mood and Affect: Mood normal.     ?   Thought Content: Thought content normal.     ?   Judgment: Judgment normal.  ? ? ? ?UC Treatments / Results  ?Labs ?(all labs ordered are listed, but only abnormal results are displayed) ?Labs Reviewed - No data to display ? ?EKG ? ? ?Radiology ?DG Knee Complete 4 Views Right ? ?Result Date: 08/09/2021 ?CLINICAL DATA:  Basketball injury.  Pain. EXAM: RIGHT KNEE - COMPLETE 4+ VIEW COMPARISON:  None. FINDINGS: Four view study. No fracture or dislocation. No evidence of joint effusion. No worrisome lytic or sclerotic osseous abnormality. IMPRESSION: No acute bony findings. Electronically Signed   By: Misty Stanley M.D.   On: 08/09/2021 09:10   ? ?Procedures ?Procedures (including critical care time) ? ?Medications Ordered in UC ?Medications - No data to display ? ?Initial Impression / Assessment and Plan / UC Course  ?I have reviewed the triage vital signs and the nursing notes. ? ?Pertinent labs & imaging results that were available during my care of the patient were reviewed  by me and considered in my medical decision making (see chart for details). ? ? X-ray of the right knee negative for acute bony abnormality, exam very reassuring and suspicious for  contusion of the right knee.  Discussed compression sleeve, RICE protocol.  Return for worsening symptoms. ? ?Final Clinical Impressions(s) / UC Diagnoses  ? ?Final diagnoses:  ?Contusion of right knee, initial encounter  ? ?Discharge Instructions   ?None ?  ? ?ED Prescriptions   ?None ?  ? ?PDMP not reviewed this encounter. ?  ?Volney American, PA-C ?08/09/21 1259 ? ?

## 2021-09-30 ENCOUNTER — Encounter (HOSPITAL_COMMUNITY): Payer: Self-pay

## 2021-09-30 ENCOUNTER — Other Ambulatory Visit: Payer: Self-pay

## 2021-09-30 ENCOUNTER — Emergency Department (HOSPITAL_COMMUNITY): Payer: Managed Care, Other (non HMO)

## 2021-09-30 ENCOUNTER — Emergency Department (HOSPITAL_COMMUNITY)
Admission: EM | Admit: 2021-09-30 | Discharge: 2021-09-30 | Disposition: A | Discharge status: Home / Self Care | Payer: Managed Care, Other (non HMO) | Attending: Emergency Medicine | Admitting: Emergency Medicine

## 2021-09-30 DIAGNOSIS — S39012A Strain of muscle, fascia and tendon of lower back, initial encounter: Secondary | ICD-10-CM | POA: Diagnosis not present

## 2021-09-30 DIAGNOSIS — Y92410 Unspecified street and highway as the place of occurrence of the external cause: Secondary | ICD-10-CM | POA: Insufficient documentation

## 2021-09-30 DIAGNOSIS — S161XXA Strain of muscle, fascia and tendon at neck level, initial encounter: Secondary | ICD-10-CM | POA: Insufficient documentation

## 2021-09-30 DIAGNOSIS — M546 Pain in thoracic spine: Secondary | ICD-10-CM | POA: Insufficient documentation

## 2021-09-30 DIAGNOSIS — S199XXA Unspecified injury of neck, initial encounter: Secondary | ICD-10-CM | POA: Diagnosis present

## 2021-09-30 DIAGNOSIS — R519 Headache, unspecified: Secondary | ICD-10-CM | POA: Insufficient documentation

## 2021-09-30 MED ORDER — METHOCARBAMOL 500 MG PO TABS: 500.0000 mg | ORAL_TABLET | Freq: Three times a day (TID) | ORAL | 0 refills | Status: DC | PRN

## 2021-09-30 MED ORDER — OXYCODONE-ACETAMINOPHEN 5-325 MG PO TABS
1.0000 | ORAL_TABLET | Freq: Once | ORAL | Status: AC
  Administered 2021-09-30: 1 via ORAL
  Filled 2021-09-30: qty 1
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
## 2022-02-26 ENCOUNTER — Ambulatory Visit
Admission: RE | Admit: 2022-02-26 | Discharge: 2022-02-26 | Disposition: A | Discharge status: Home / Self Care | Payer: Managed Care, Other (non HMO) | Source: Ambulatory Visit | Attending: Family Medicine | Admitting: Family Medicine

## 2022-02-26 VITALS — BP 127/92 | HR 98 | Temp 97.7°F | Resp 18

## 2022-02-26 DIAGNOSIS — R112 Nausea with vomiting, unspecified: Secondary | ICD-10-CM | POA: Diagnosis not present

## 2022-02-26 DIAGNOSIS — R197 Diarrhea, unspecified: Secondary | ICD-10-CM

## 2022-02-26 LAB — POCT URINALYSIS DIP (MANUAL ENTRY)
Bilirubin, UA: NEGATIVE
Blood, UA: NEGATIVE
Glucose, UA: NEGATIVE mg/dL
Ketones, POC UA: NEGATIVE mg/dL
Leukocytes, UA: NEGATIVE
Nitrite, UA: NEGATIVE
Protein Ur, POC: NEGATIVE mg/dL
Spec Grav, UA: 1.015 (ref 1.010–1.025)
Urobilinogen, UA: 0.2 E.U./dL
pH, UA: 6.5 (ref 5.0–8.0)

## 2022-02-26 MED ORDER — ONDANSETRON 4 MG PO TBDP: 4.0000 mg | ORAL_TABLET | Freq: Three times a day (TID) | ORAL | 0 refills | Status: DC | PRN

## 2022-02-26 MED ORDER — LOPERAMIDE HCL 2 MG PO CAPS: 2.0000 mg | ORAL_CAPSULE | Freq: Four times a day (QID) | ORAL | 0 refills | Status: DC | PRN

## 2022-02-26 MED ORDER — ONDANSETRON 4 MG PO TBDP
4.0000 mg | ORAL_TABLET | Freq: Once | ORAL | Status: AC
  Administered 2022-02-26: 4 mg via ORAL

## 2022-02-26 NOTE — ED Triage Notes (Signed)
abdminal pain and nausea x 3 days, Having diarrhea today. Thew up this afternoon, not able to keep food down today. Able to keep water down but does have nauseas when trying to eat or drink.

## 2022-02-26 NOTE — ED Provider Notes (Signed)
[facility]    CSN: [id number] Arrival date & time: 02/26/22  1601      History   Chief Complaint Chief Complaint  Patient presents with   Abdominal Pain    I've been having nausea, gas, gurgling stomach and my stomach is cramping and tender to touch. - Entered by patient    HPI Elaine Hunt is a 26 y.o. female.   Patient presenting today with about 3 days of lower abdominal pain and cramping, nausea, vomiting, diarrhea.  She is tolerating p.o. fluids but not solids well at this time.  So far not trying anything over-the-counter for symptoms.  No known sick contacts recently but was around a lot of people at home coming over the weekend.  No new foods, medications, supplements.  No chronic GI issues that she is aware of other than GERD which she states feels very different than this.    Past Medical History:  Diagnosis Date   Allergy    Asthma    Chronic knee pain    Constipation    GERD (gastroesophageal reflux disease)    Heart murmur    Hypertension     Patient Active Problem List   Diagnosis Date Noted   Normal weight, pediatric, BMI 5th to 84th percentile for age 09/21/2014   Asthma, chronic 03/23/2013   Acne 03/23/2013   Knee pain, bilateral 03/23/2013   Unspecified constipation 03/23/2013    History reviewed. No pertinent surgical history.  OB History     Gravida  0   Para  0   Term  0   Preterm  0   AB  0   Living  0      SAB  0   IAB  0   Ectopic  0   Multiple  0   Live Births               Home Medications    Prior to Admission medications   Medication Sig Start Date End Date Taking? Authorizing Provider  loperamide (IMODIUM) 2 MG capsule Take 1 capsule (2 mg total) by mouth 4 (four) times daily as needed for diarrhea or loose stools. 02/26/22  Yes Particia Nearing, PA-C  ondansetron (ZOFRAN-ODT) 4 MG disintegrating tablet Take 1 tablet (4 mg total) by mouth every 8 (eight) hours as needed for nausea or  vomiting. 02/26/22  Yes Particia Nearing, PA-C  methocarbamol (ROBAXIN) 500 MG tablet Take 1 tablet (500 mg total) by mouth every 8 (eight) hours as needed for muscle spasms. 09/30/21   Pollina, Canary Brim, MD    Family History Family History  Problem Relation Age of Onset   Heart failure Mother    Hypertension Mother    Arthritis Mother    Asthma Mother    COPD Mother    Depression Mother    Hyperlipidemia Mother    Kidney disease Mother    Anxiety disorder Mother    Panic disorder Mother    Cancer Father        brain tumor   Stroke Maternal Grandmother    Heart disease Maternal Grandmother    Marfan syndrome Maternal Grandmother    Hyperlipidemia Maternal Grandmother    Hypertension Maternal Uncle    Heart failure Other    Stroke Other     Social History Social History   Tobacco Use   Smoking status: Never   Smokeless tobacco: Never  Vaping Use   Vaping Use: Never used  Substance Use  Topics   Alcohol use: Yes    Alcohol/week: 6.0 standard drinks of alcohol    Types: 3 Standard drinks or equivalent, 3 Shots of liquor per week   Drug use: No     Allergies   Ibuprofen and Penicillins   Review of Systems Review of Systems PER HPI  Physical Exam Triage Vital Signs ED Triage Vitals  Enc Vitals Group     BP 02/26/22 1608 (!) 153/92     Pulse Rate 02/26/22 1608 (!) 127     Resp 02/26/22 1609 18     Temp 02/26/22 1608 97.7 F (36.5 C)     Temp Source 02/26/22 1608 Oral     SpO2 02/26/22 1608 98 %     Weight --      Height --      Head Circumference --      Peak Flow --      Pain Score 02/26/22 1609 6     Pain Loc --      Pain Edu? --      Excl. in GC? --    No data found.  Updated Vital Signs BP (!) 127/92 (BP Location: Right Arm)   Pulse 98   Temp 97.7 F (36.5 C) (Oral)   Resp 18   LMP 02/16/2022   SpO2 98%   Visual Acuity Right Eye Distance:   Left Eye Distance:   Bilateral Distance:    Right Eye Near:   Left Eye Near:     Bilateral Near:     Physical Exam Vitals and nursing note reviewed.  Constitutional:      Appearance: Normal appearance. She is not ill-appearing.  HENT:     Head: Atraumatic.     Mouth/Throat:     Mouth: Mucous membranes are moist.  Eyes:     Extraocular Movements: Extraocular movements intact.     Conjunctiva/sclera: Conjunctivae normal.  Cardiovascular:     Rate and Rhythm: Normal rate and regular rhythm.     Heart sounds: Normal heart sounds.  Pulmonary:     Effort: Pulmonary effort is normal.     Breath sounds: Normal breath sounds.  Abdominal:     General: Bowel sounds are normal. There is no distension.     Palpations: Abdomen is soft.     Tenderness: There is no abdominal tenderness. There is no right CVA tenderness, left CVA tenderness or guarding.  Musculoskeletal:        General: Normal range of motion.     Cervical back: Normal range of motion and neck supple.  Skin:    General: Skin is warm and dry.  Neurological:     Mental Status: She is alert and oriented to person, place, and time.  Psychiatric:        Mood and Affect: Mood normal.        Thought Content: Thought content normal.        Judgment: Judgment normal.      UC Treatments / Results  Labs (all labs ordered are listed, but only abnormal results are displayed) Labs Reviewed  POCT URINALYSIS DIP (MANUAL ENTRY)    EKG   Radiology No results found.  Procedures Procedures (including critical care time)  Medications Ordered in UC Medications  ondansetron (ZOFRAN-ODT) disintegrating tablet 4 mg (4 mg Oral Given 02/26/22 1700)    Initial Impression / Assessment and Plan / UC Course  I have reviewed the triage vital signs and the nursing notes.  Pertinent labs & imaging  results that were available during my care of the patient were reviewed by me and considered in my medical decision making (see chart for details).     Vital signs and exam very reassuring today, no red flag findings.   Urinalysis without evidence of dehydration, infection or other abnormality.  Will provide a dose of Zofran in clinic for active nausea and Zofran, Imodium for as needed use at home.  Brat diet, push fluids, rest.  Return for worsening symptoms.  Final Clinical Impressions(s) / UC Diagnoses   Final diagnoses:  Nausea vomiting and diarrhea     Discharge Instructions      I have sent over nausea and diarrhea medication for as needed use.  Make sure to be staying well-hydrated and eating bland foods.  Go to the emergency department if your symptoms worsen    ED Prescriptions     Medication Sig Dispense Auth. Provider   ondansetron (ZOFRAN-ODT) 4 MG disintegrating tablet Take 1 tablet (4 mg total) by mouth every 8 (eight) hours as needed for nausea or vomiting. 20 tablet Volney American, [place]   loperamide (IMODIUM) 2 MG capsule Take 1 capsule (2 mg total) by mouth 4 (four) times daily as needed for diarrhea or loose stools. 12 capsule Volney American, [place]      PDMP not reviewed this encounter.   Volney American, [place] 02/26/22 1711

## 2022-02-26 NOTE — Discharge Instructions (Signed)
I have sent over nausea and diarrhea medication for as needed use.  Make sure to be staying well-hydrated and eating bland foods.  Go to the emergency department if your symptoms worsen

## 2022-05-06 ENCOUNTER — Emergency Department (HOSPITAL_COMMUNITY)
Admission: EM | Admit: 2022-05-06 | Discharge: 2022-05-06 | Disposition: A | Discharge status: Home / Self Care | Payer: Managed Care, Other (non HMO)

## 2022-05-06 NOTE — ED Notes (Signed)
Pt called for triage, no answer pt not seen in waiting room

## 2022-05-06 NOTE — ED Triage Notes (Signed)
Pt called to triage x, no answer
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
## 2022-10-27 ENCOUNTER — Ambulatory Visit
Admission: EM | Admit: 2022-10-27 | Discharge: 2022-10-27 | Disposition: A | Discharge status: Home / Self Care | Payer: Managed Care, Other (non HMO) | Attending: Nurse Practitioner | Admitting: Nurse Practitioner

## 2022-10-27 DIAGNOSIS — J069 Acute upper respiratory infection, unspecified: Secondary | ICD-10-CM | POA: Insufficient documentation

## 2022-10-27 DIAGNOSIS — Z1152 Encounter for screening for COVID-19: Secondary | ICD-10-CM | POA: Diagnosis present

## 2022-10-27 DIAGNOSIS — J029 Acute pharyngitis, unspecified: Secondary | ICD-10-CM | POA: Insufficient documentation

## 2022-10-27 LAB — POCT RAPID STREP A (OFFICE): Rapid Strep A Screen: NEGATIVE

## 2022-10-27 LAB — POCT MONO SCREEN (KUC): Mono, POC: NEGATIVE

## 2022-10-27 MED ORDER — LIDOCAINE VISCOUS HCL 2 % MT SOLN: 15.0000 mL | OROMUCOSAL | 0 refills | Status: DC | PRN

## 2022-10-27 MED ORDER — BENZONATATE 100 MG PO CAPS: 100.0000 mg | ORAL_CAPSULE | Freq: Three times a day (TID) | ORAL | 0 refills | Status: DC | PRN

## 2022-10-27 NOTE — ED Triage Notes (Signed)
Sore throat, headache, cough, body aches, chills  fever 102 that started Saturday. Taking OTC cough medication.

## 2022-10-27 NOTE — Discharge Instructions (Addendum)
You have a viral upper respiratory infection.  Symptoms should improve over the next week to 10 days.  If you develop chest pain or shortness of breath, go to the emergency room.  Rapid strep throat test and monotest today were negative.  The throat culture is pending to check for strep throat.  We will contact you later this week if it is positive.  We have tested you today for COVID-19.  You will see the results in Mychart and we will call you with positive results.  Please stay home and isolate until you are aware of the results.    Some things that can make you feel better are: - Increased rest - Increasing fluid with water/sugar free electrolytes - Acetaminophen and ibuprofen as needed for fever/pain - Salt water gargling, chloraseptic spray and throat lozenges - OTC guaifenesin (Mucinex) 600 mg twice daily - Saline sinus flushes or a neti pot - Humidifying the air -Tessalon Perles during the day as needed for dry cough - Lidocaine rinses as needed for sore throat

## 2022-10-27 NOTE — ED Provider Notes (Signed)
[facility]    CSN: [id number] Arrival date & time: 10/27/22  1009      History   Chief Complaint Chief Complaint  Patient presents with   Sore Throat    Sore throat with nasal drip, dry cough, headache, neck pain/body aches and had fever for 2 days. - Entered by patient    HPI Elaine Hunt is a 27 y.o. female.   Patient presents today with 2-day history of fever, Tmax 102 F, body aches and chills, dry cough, runny and stuffy nose, postnasal drainage and sore throat, headache, decreased appetite because it hurts to swallow, loss of taste, and fatigue.  She denies congested cough, shortness of breath or chest pain, ear pain, abdominal pain, nausea/vomiting, and diarrhea.  No new rash.  Reports she is from [place] and is visiting friends and her friends did not tell her before she came that they were sick.  Reports she took a COVID-19 test at home yesterday that was negative.  Has been taking over-the-counter cough syrup with Tylenol and cough suppressant with mild improvement in symptoms.    Past Medical History:  Diagnosis Date   Allergy    Asthma    Chronic knee pain    Constipation    GERD (gastroesophageal reflux disease)    Heart murmur    Hypertension     Patient Active Problem List   Diagnosis Date Noted   Normal weight, pediatric, BMI 5th to 84th percentile for age 52/19/2016   Asthma, chronic 03/23/2013   Acne 03/23/2013   Knee pain, bilateral 03/23/2013   Unspecified constipation 03/23/2013    History reviewed. No pertinent surgical history.  OB History     Gravida  0   Para  0   Term  0   Preterm  0   AB  0   Living  0      SAB  0   IAB  0   Ectopic  0   Multiple  0   Live Births               Home Medications    Prior to Admission medications   Medication Sig Start Date End Date Taking? Authorizing Provider  benzonatate (TESSALON) 100 MG capsule Take 1 capsule (100 mg total) by mouth 3 (three) times daily  as needed for cough. Do not take with alcohol or while driving or operating heavy machinery.  May cause drowsiness. 10/27/22  Yes Cathlean Marseilles A, NP  lidocaine (XYLOCAINE) 2 % solution Use as directed 15 mLs in the mouth or throat every 3 (three) hours as needed for mouth pain. Gargle and spit as needed for throat pain 10/27/22  Yes Valentino Nose, NP    Family History Family History  Problem Relation Age of Onset   Heart failure Mother    Hypertension Mother    Arthritis Mother    Asthma Mother    COPD Mother    Depression Mother    Hyperlipidemia Mother    Kidney disease Mother    Anxiety disorder Mother    Panic disorder Mother    Cancer Father        brain tumor   Stroke Maternal Grandmother    Heart disease Maternal Grandmother    Marfan syndrome Maternal Grandmother    Hyperlipidemia Maternal Grandmother    Hypertension Maternal Uncle    Heart failure Other    Stroke Other     Social History Social History  Tobacco Use   Smoking status: Never   Smokeless tobacco: Never  Vaping Use   Vaping Use: Never used  Substance Use Topics   Alcohol use: Yes    Alcohol/week: 6.0 standard drinks of alcohol    Types: 3 Standard drinks or equivalent, 3 Shots of liquor per week   Drug use: No     Allergies   Ibuprofen and Penicillins   Review of Systems Review of Systems Per HPI  Physical Exam Triage Vital Signs ED Triage Vitals [10/27/22 1037]  Enc Vitals Group     BP (!) 144/98     Pulse Rate (!) 115     Resp 18     Temp 98.5 F (36.9 C)     Temp Source Oral     SpO2 97 %     Weight      Height      Head Circumference      Peak Flow      Pain Score 8     Pain Loc      Pain Edu?      Excl. in GC?    No data found.  Updated Vital Signs BP (!) 144/98 (BP Location: Right Arm)   Pulse (!) 115   Temp 98.5 F (36.9 C) (Oral)   Resp 18   LMP 10/19/2022 (Exact Date)   SpO2 97%   Visual Acuity Right Eye Distance:   Left Eye Distance:    Bilateral Distance:    Right Eye Near:   Left Eye Near:    Bilateral Near:     Physical Exam Vitals and nursing note reviewed.  Constitutional:      General: She is not in acute distress.    Appearance: Normal appearance. She is not ill-appearing or toxic-appearing.  HENT:     Head: Normocephalic and atraumatic.     Right Ear: Tympanic membrane, ear canal and external ear normal. No drainage, swelling or tenderness. No middle ear effusion. Tympanic membrane is not erythematous.     Left Ear: Tympanic membrane, ear canal and external ear normal. No drainage, swelling or tenderness.  No middle ear effusion. Tympanic membrane is not erythematous.     Nose: Congestion and rhinorrhea present.     Mouth/Throat:     Mouth: Mucous membranes are moist.     Pharynx: Oropharynx is clear. Posterior oropharyngeal erythema present. No oropharyngeal exudate.     Tonsils: Tonsillar exudate present. 1+ on the right. 1+ on the left.  Eyes:     General: No scleral icterus.    Extraocular Movements: Extraocular movements intact.  Cardiovascular:     Rate and Rhythm: Normal rate and regular rhythm.  Pulmonary:     Effort: Pulmonary effort is normal. No respiratory distress.     Breath sounds: Normal breath sounds. No wheezing, rhonchi or rales.  Abdominal:     General: Abdomen is flat. Bowel sounds are normal. There is no distension.     Palpations: Abdomen is soft.  Musculoskeletal:     Cervical back: Normal range of motion and neck supple.  Lymphadenopathy:     Cervical: No cervical adenopathy.  Skin:    General: Skin is warm and dry.     Coloration: Skin is not jaundiced or pale.     Findings: No erythema or rash.  Neurological:     Mental Status: She is alert and oriented to person, place, and time.     Motor: No weakness.  Psychiatric:  Behavior: Behavior is cooperative.      UC Treatments / Results  Labs (all labs ordered are listed, but only abnormal results are  displayed) Labs Reviewed  SARS CORONAVIRUS 2 (TAT 6-24 HRS)  CULTURE, GROUP A STREP [facility])  POCT RAPID STREP A (OFFICE)  POCT MONO SCREEN ([facility])    EKG   Radiology No results found.  Procedures Procedures (including critical care time)  Medications Ordered in UC Medications - No data to display  Initial Impression / Assessment and Plan / UC Course  I have reviewed the triage vital signs and the nursing notes.  Pertinent labs & imaging results that were available during my care of the patient were reviewed by me and considered in my medical decision making (see chart for details).   Patient is well-appearing, afebrile, not tachypneic, oxygenating well on room air.  Patient is mildly hypertensive and tachycardic today in triage.  1. Viral URI with cough 2. Acute pharyngitis, unspecified etiology 3. Encounter for screening for COVID-19 Rapid strep throat test today is negative; Monospot is also negative today Throat culture is pending Centor score today is 2 COVID-19 testing obtained Supportive care discussed with patient including lidocaine rinses, Tylenol/ibuprofen as needed for pain, cough suppressant medication Strict ER and return precautions discussed with patient Note given for work  The patient was given the opportunity to ask questions.  All questions answered to their satisfaction.  The patient is in agreement to this plan.    Final Clinical Impressions(s) / UC Diagnoses   Final diagnoses:  Viral URI with cough  Acute pharyngitis, unspecified etiology  Encounter for screening for COVID-19     Discharge Instructions      You have a viral upper respiratory infection.  Symptoms should improve over the next week to 10 days.  If you develop chest pain or shortness of breath, go to the emergency room.  Rapid strep throat test and monotest today were negative.  The throat culture is pending to check for strep throat.  We will contact you later this week if it is  positive.  We have tested you today for COVID-19.  You will see the results in Mychart and we will call you with positive results.  Please stay home and isolate until you are aware of the results.    Some things that can make you feel better are: - Increased rest - Increasing fluid with water/sugar free electrolytes - Acetaminophen and ibuprofen as needed for fever/pain - Salt water gargling, chloraseptic spray and throat lozenges - OTC guaifenesin (Mucinex) 600 mg twice daily - Saline sinus flushes or a neti pot - Humidifying the air -Tessalon Perles during the day as needed for dry cough - Lidocaine rinses as needed for sore throat     ED Prescriptions     Medication Sig Dispense Auth. Provider   benzonatate (TESSALON) 100 MG capsule Take 1 capsule (100 mg total) by mouth 3 (three) times daily as needed for cough. Do not take with alcohol or while driving or operating heavy machinery.  May cause drowsiness. 21 capsule Cathlean Marseilles A, NP   lidocaine (XYLOCAINE) 2 % solution Use as directed 15 mLs in the mouth or throat every 3 (three) hours as needed for mouth pain. Gargle and spit as needed for throat pain 100 mL Valentino Nose, NP      PDMP not reviewed this encounter.   Valentino Nose, NP 10/27/22 1147

## 2022-10-28 LAB — SARS CORONAVIRUS 2 (TAT 6-24 HRS): SARS Coronavirus 2: NEGATIVE

## 2022-10-30 LAB — CULTURE, GROUP A STREP (THRC)

## 2022-11-05 ENCOUNTER — Ambulatory Visit (INDEPENDENT_AMBULATORY_CARE_PROVIDER_SITE_OTHER): Payer: Commercial Managed Care - POS | Admitting: Family

## 2022-11-05 ENCOUNTER — Encounter (INDEPENDENT_AMBULATORY_CARE_PROVIDER_SITE_OTHER): Payer: Self-pay

## 2022-11-05 VITALS — BP 133/94 | HR 107 | Temp 97.4°F | Resp 16 | Ht 61.5 in | Wt 191.0 lb

## 2022-11-05 DIAGNOSIS — J019 Acute sinusitis, unspecified: Secondary | ICD-10-CM

## 2022-11-05 DIAGNOSIS — B9689 Other specified bacterial agents as the cause of diseases classified elsewhere: Secondary | ICD-10-CM

## 2022-11-05 DIAGNOSIS — J029 Acute pharyngitis, unspecified: Secondary | ICD-10-CM

## 2022-11-05 LAB — STREP A POCT GOHEALTH: Rapid Strep A Screen POCT: NEGATIVE

## 2022-11-05 MED ORDER — DOXYCYCLINE HYCLATE 100 MG PO TABS: 100.0000 mg | ORAL_TABLET | Freq: Two times a day (BID) | ORAL | 0 refills | Status: AC

## 2022-11-05 NOTE — Progress Notes (Signed)
Patient: Kerri Downs    Date: 11/05/2022   MRN: [id number]        Subjective        Chief Complaint   Patient presents with    Sore Throat     Cough with sputum 2 weeks.  Took Mucin ex .            Sore Throat        Kerri Downs is a 27 y.o. female presenting to Urgent Care with complaint of congestion, fever 102, headache, post nasal drip, productive cough, and sore throat that began 2 weeks ago. Pt reports she had a fever last night. Also reports discolored mucus- yellow,greenish.   Patient denies ear ache, chills, and SOB.  Has attempted mucinex prior to visit. Marland Kitchen  denies recent travel.       History:  Pertinent Past Medical, Surgical, Family and Social History were reviewed.     Current Outpatient Medications:     doxycycline (VIBRA-TABS) 100 MG tablet, Take 1 tablet (100 mg) by mouth 2 (two) times daily for 7 days, Disp: 14 tablet, Rfl: 0  Allergies   Allergen Reactions    Ibuprofen     Penicillins Rash     Medications and Allergies reviewed.         Objective   Vitals:    11/05/22 1440   BP: (!) 133/94   BP Site: Right arm   Patient Position: Sitting   Cuff Size: Medium   Pulse: (!) 107   Resp: 16   Temp: 97.4 F (36.3 C)   SpO2: 98%   Weight: 86.6 kg (191 lb)   Height: 1.562 m (5' 1.5")     Body mass index is 35.5 kg/m.    Physical Exam  HENT:      Right Ear: Tympanic membrane, ear canal and external ear normal.      Left Ear: Tympanic membrane, ear canal and external ear normal.      Nose: Mucosal edema and congestion present. No rhinorrhea.      Right Sinus: Maxillary sinus tenderness present. No frontal sinus tenderness.      Left Sinus: Maxillary sinus tenderness present. No frontal sinus tenderness.      Mouth/Throat:      Pharynx: Pharyngeal swelling, oropharyngeal exudate and posterior oropharyngeal erythema present.      Tonsils: 1+ on the right. 1+ on the left.   Cardiovascular:      Rate and Rhythm: Normal rate and regular rhythm.      Heart sounds: Normal heart sounds.   Pulmonary:      Effort: Pulmonary  effort is normal.      Breath sounds: Normal breath sounds.   Lymphadenopathy:      Head:      Right side of head: No tonsillar adenopathy.      Left side of head: No tonsillar adenopathy.      Cervical: No cervical adenopathy.   Neurological:      Mental Status: She is alert.              UCC Course   LABS  The following POCT tests were ordered, reviewed and discussed with the patient/family.     Results       Procedure Component Value Units Date/Time    Rapid Strep A POCT [[id number]]  (Normal) Collected: 11/05/22 1459    Specimen: Throat Updated: 11/05/22 1500     POCT QC Pass     Rapid Strep A Screen POCT  Negative          There were no x-rays reviewed with this patient during the visit.  No current facility-administered medications for this visit.          Procedures   Procedures      MDM/Assessment      Differential Diagnosis: Sinusitis, Bronchitis, Pharyngitis, Pneumonia, Influenza, COVID-19 and Allergic Rhinitis  Encounter Diagnoses   Name Primary?    Sore throat     Acute bacterial sinusitis Yes            Plan    OTC Mucinex twice a day as needed  OTC nasal steroid (Rhinocort, Nasacort, Flonase) two sprays each nostril daily x 2 weeks.  Side effects discussed with patient including headache, nosebleed, sore throat, or fever.  Tylenol/ibuprofen as needed  Nasal saline rinses twice a day  Optimize H20 hydration  Inhale steam three to four times a day ( hot shower )  Warm compresses applied over sinus region three to four times a day as needed  Salt water gargles 2-3 times a day  Daily probiotic therapy while on antibiotics   Due to persistent symptoms and typical physical exam findings, recommend antibiotic therapy.  Recommend treatment with Doxycycline therapy.  Side effects of Doxycycline discussed with patient including rash, nausea, GI upset or photosensitivity.  Complete entire course of antibiotic therapy as directed.    Orders Placed This Encounter   Procedures    Rapid Strep A POCT     Requested  Prescriptions     Signed Prescriptions Disp Refills    doxycycline (VIBRA-TABS) 100 MG tablet 14 tablet 0     Sig: Take 1 tablet (100 mg) by mouth 2 (two) times daily for 7 days       Discussed results and diagnosis with patient/family.  Reviewed warning signs for worsening condition, as well as, indications for follow-up with primary care physician and return to urgent care clinic.   Patient/family expressed understanding of instructions.     An After Visit Summary with pertinent information was made available to patient/family via MyChart or in-print.

## 2023-01-06 ENCOUNTER — Encounter (INDEPENDENT_AMBULATORY_CARE_PROVIDER_SITE_OTHER): Payer: Self-pay

## 2023-01-06 ENCOUNTER — Ambulatory Visit (INDEPENDENT_AMBULATORY_CARE_PROVIDER_SITE_OTHER): Payer: Commercial Managed Care - POS | Admitting: Physician Assistant

## 2023-01-06 ENCOUNTER — Ambulatory Visit (INDEPENDENT_AMBULATORY_CARE_PROVIDER_SITE_OTHER): Payer: Commercial Managed Care - POS

## 2023-01-06 VITALS — BP 138/87 | HR 101 | Temp 98.5°F | Resp 18 | Ht 61.5 in | Wt 197.0 lb

## 2023-01-06 DIAGNOSIS — S66911A Strain of unspecified muscle, fascia and tendon at wrist and hand level, right hand, initial encounter: Secondary | ICD-10-CM

## 2023-01-06 DIAGNOSIS — S6791XA Crushing injury of unspecified part(s) of right wrist, hand and fingers, initial encounter: Secondary | ICD-10-CM

## 2023-01-06 NOTE — Patient Instructions (Signed)
-  Take ibuprofen with food as needed for pain  Apply cool compresses on injured area for 10-55min 1-2times per day for first few days  Elevate extremity above heart level during rest  avoid strenuous activities that might cause more damage to injured extremity until better  Stretch to your tolerance  Use ace wrap as needed if it helps alleviate pain  If worsening pain, numbness, etc, go to the Ed  If you do not see symptom improvement within 1-2 weeks, follow up with ortho for further assessment

## 2023-01-06 NOTE — Progress Notes (Signed)
[facility]  URGENT  CARE  PROGRESS NOTE     Patient: Kerri Downs   Date: 01/06/2023   MRN: [id number]       Kerri Downs is a 27 y.o. female      HISTORY     History obtained from: Patient    Chief Complaint   Patient presents with    Hand Injury     ONSET X LAST NIGHT, R HAND INJURY, INJURED HAND PUNCHING A PUNCHING BAG        HPI   27y/o female, presents to the clinic with R hand/wrist injury last night. Pt states that she was punching the punching bag with gloves on when she noticed immediate pain on R hand/wrist with edema. Denies numbness, or any other symptoms.  Denies chance of pregnancy    Review of Systems  See hpi    History:    Pertinent Past Medical, Surgical, Family and Social History were reviewed.      Current Medications[1]    Allergies[2]    Medications and Allergies reviewed.    PHYSICAL EXAM     Vitals:    01/06/23 1317   BP: 138/87   Pulse: (!) 101   Resp: 18   Temp: 98.5 F (36.9 C)   SpO2: 98%   Weight: 89.4 kg (197 lb)   Height: 1.562 m (5' 1.5")       Physical Exam  Constitutional:       General: She is not in acute distress.     Appearance: Normal appearance.   Cardiovascular:      Pulses: Normal pulses.   Pulmonary:      Effort: Pulmonary effort is normal.   Musculoskeletal:      Comments: R wrist/hand: ttp on radial side of wrist and hand. FROM with mild pain, no snuffbox ttp. No ecchymosis, edema, erythema, or open wound  Rest of R UE WNL   Neurological:      Mental Status: She is alert.      Sensory: No sensory deficit.      Motor: No weakness.   Skin:     Capillary Refill: Capillary refill takes less than 2 seconds.      Comments: See msk section   Psychiatric:         Behavior: Behavior normal.         UCC COURSE     There were no labs reviewed with this patient during the visit.    X-Ray  The following X-ray studies were ordered, visualized and independently interpreted by me. Results were discussed with the patient/family.     XR Wrist Right 3+ Views    Result Date: 01/06/2023  HISTORY:  Radial sided thenar pain. COMPARISON: None. FINDINGS: No identifiable acute fracture or dislocation. The joint spaces are within normal limits. There are no degenerative changes. No erosions are detected.     1.No acute abnormality. Emeline Darling, MD 01/06/2023 2:19 PM      Current Inpatient Medications with Last Dose Taken[3]      X-ray ordered and images reviewed.   R wrist 3 views  Preliminary reading: no fracture or dislocation  Results of x-ray discussed with the patient/family.   Radiologist reading also reviewed and copy of report provided to patient.     PROCEDURES     Procedures    MEDICAL DECISION MAKING     History, physical, labs/studies most consistent with R wrist strain as the diagnosis.        Chart  Review:  Prior PCP, Specialist and/or ED notes reviewed today: Not Applicable  Prior labs/images/studies reviewed today: Not Applicable    Initial Differential Diagnosis: strain, sprain, fracture, dislocation, nv injury, compartment syndrome, ligament tear, tendonitis, tendon tear/rupture        ASSESSMENT     Encounter Diagnosis   Name Primary?    Muscle strain of right wrist, initial encounter Yes                PLAN      PLAN: xray WNL  Stable for dispo  -Take ibuprofen with food as needed for pain  Apply cool compresses on injured area for 10-3min 1-2times per day for first few days  Elevate extremity above heart level during rest  avoid strenuous activities that might cause more damage to injured extremity until better  Stretch to your tolerance  Use ace wrap as needed if it helps alleviate pain  If worsening pain, numbness, etc, go to the Ed  If you do not see symptom improvement within 1-2 weeks, follow up with ortho for further assessment                 Orders Placed This Encounter   Procedures    Task for Ace Wrap (3 or 4 IN)    XR Wrist Right 3+ Views     Requested Prescriptions      No prescriptions requested or ordered in this encounter       Discussed results and diagnosis with  patient/family.  Reviewed warning signs for worsening condition, as well as, indications for follow-up with primary care physician and return to urgent care clinic.   Patient/family expressed understanding of instructions.     An After Visit Summary was provided to the patient.           [1] No current outpatient medications on file.  [2]   Allergies  Allergen Reactions    Ibuprofen     Penicillins Rash   [3]   No current facility-administered medications for this visit.

## 2023-12-01 ENCOUNTER — Ambulatory Visit (INDEPENDENT_AMBULATORY_CARE_PROVIDER_SITE_OTHER): Admitting: Physician Assistant

## 2023-12-01 ENCOUNTER — Encounter (INDEPENDENT_AMBULATORY_CARE_PROVIDER_SITE_OTHER): Payer: Self-pay

## 2023-12-01 VITALS — BP 141/96 | HR 99 | Temp 97.5°F | Resp 18 | Ht 61.5 in | Wt 230.0 lb

## 2023-12-01 DIAGNOSIS — G43009 Migraine without aura, not intractable, without status migrainosus: Secondary | ICD-10-CM

## 2023-12-01 MED ORDER — KETOROLAC TROMETHAMINE 30 MG/ML IJ SOLN
30.0000 mg | Freq: Once | INTRAMUSCULAR | Status: AC
Start: 2023-12-01 — End: 2023-12-01
  Administered 2023-12-01: 30 mg via INTRAMUSCULAR

## 2023-12-01 MED ORDER — ONDANSETRON 4 MG PO TBDP
4.0000 mg | ORAL_TABLET | Freq: Once | ORAL | Status: AC
Start: 2023-12-01 — End: 2023-12-01
  Administered 2023-12-01: 4 mg via ORAL

## 2023-12-01 NOTE — Progress Notes (Addendum)
 [place]  OFFICE NOTE         Subjective   Historian: Patient      Chief Complaint   Patient presents with    Headache     Patient presenting with constant left sided headache at temporal area beginning last night. Patient also reports pain behind eye and nausea when seeing light.       Headache       Kerri Downs is a 28 y.o. female who presents for headache that she thinks is a migraine HA that started yesterday with left temple and eye pain with nausea and continued HA into the next day. Pt has had a couple of migraines in the past. +photophobia. Denies fever, cough, congestion and sore throat     History:  Medications and Allergies reviewed.   Pertinent Past Medical, Surgical, Family and Social History were reviewed.          Objective     Vitals:    12/01/23 1600   BP: (!) 141/96   BP Site: Right arm   Patient Position: Sitting   Cuff Size: Large   Pulse: 99   Resp: 18   Temp: 97.5 F (36.4 C)   TempSrc: Temporal   SpO2: 99%   Weight: 104.3 kg (230 lb)   Height: 1.562 m (5' 1.5)     Physical Exam  Urgent Care Course   There were no labs reviewed with this patient during the visit.    There were no x-rays reviewed with this patient during the visit.    Administrations This Visit       ketorolac  (TORADOL ) injection 30 mg       Admin Date  12/01/2023 Action  Given Dose  30 mg Route  Intramuscular Documented By  Sherlon Raisin              ondansetron  (ZOFRAN -ODT) disintegrating tablet 4 mg       Admin Date  12/01/2023 Action  Given Dose  4 mg Route  Oral Documented By  Sherlon Raisin                  Procedures   Procedures     Assessment / Plan     Differential Diagnoses including but not limited to: HA, migraine, angle closure glaucoma, temporal arteritis   BP elevated and pt advised for recheck   You are being treated for: migraine headache  You have been prescribed: zofran  for nausea   Helpful over the counter medicine you can use:Alternate between tylenol and ibuprofen for fever/body  aches every 3 hours  Ibuprofen: 800 mg every 6 to 8 hours; maximum dose: 3.2 g/day   Tylenol: 325 to 650 mg every 4 to 6 hours as needed or 1 g every 6 hours as needed; maximum dose: 4 g/day   For follow up you should  rest, use zofran  for nausea if pain worsens return or go to ED  -Patient to follow up with primary care doctor or follow up here if symptoms worsening or not resolving as expected.   -Follow up with ED with acute worsening of symptoms.  -Patient verbalized understanding.    -Reviewed discharge instructions that are included here with patient, and printed in AVS.   -Questions answered.  -Risks and benefits of therapy discussed.  Pt agrees and understands.        Kerri Downs was seen today for headache.    Diagnoses and all orders for this visit:  Migraine without aura and without status migrainosus, not intractable  -     ketorolac  (TORADOL ) injection 30 mg  -     ondansetron  (ZOFRAN -ODT) disintegrating tablet 4 mg         The indications for early follow-up with PCP and return to UC were discussed. Patient/family received education on the working diagnosis, diagnostic uncertainties, and proposed treatment plan. Indications for emergency evaluation in the ED were reviewed. Written and verbal discharge instructions were provided and discussed and all questions from the patient/family were addressed, with no apparent barriers.

## 2023-12-01 NOTE — Patient Instructions (Addendum)
 You are being treated for: migraine headache  You have been prescribed: zofran  for nausea   Helpful over the counter medicine you can use:Alternate between tylenol and ibuprofen for fever/body aches every 3 hours  Ibuprofen: 800 mg every 6 to 8 hours; maximum dose: 3.2 g/day   Tylenol: 325 to 650 mg every 4 to 6 hours as needed or 1 g every 6 hours as needed; maximum dose: 4 g/day   For follow up you should  rest, use zofran  for nausea if pain worsens return or go to ED

## 2024-04-28 ENCOUNTER — Other Ambulatory Visit: Payer: Self-pay

## 2024-04-28 ENCOUNTER — Encounter (HOSPITAL_COMMUNITY): Payer: Self-pay | Admitting: *Deleted

## 2024-04-28 ENCOUNTER — Emergency Department (HOSPITAL_COMMUNITY)
Admission: EM | Admit: 2024-04-28 | Discharge: 2024-04-28 | Disposition: A | Discharge status: Home / Self Care | Attending: Emergency Medicine | Admitting: Emergency Medicine

## 2024-04-28 DIAGNOSIS — R112 Nausea with vomiting, unspecified: Secondary | ICD-10-CM | POA: Insufficient documentation

## 2024-04-28 DIAGNOSIS — I10 Essential (primary) hypertension: Secondary | ICD-10-CM | POA: Diagnosis not present

## 2024-04-28 DIAGNOSIS — R739 Hyperglycemia, unspecified: Secondary | ICD-10-CM | POA: Insufficient documentation

## 2024-04-28 DIAGNOSIS — D72819 Decreased white blood cell count, unspecified: Secondary | ICD-10-CM | POA: Insufficient documentation

## 2024-04-28 DIAGNOSIS — R519 Headache, unspecified: Secondary | ICD-10-CM | POA: Diagnosis present

## 2024-04-28 DIAGNOSIS — J45909 Unspecified asthma, uncomplicated: Secondary | ICD-10-CM | POA: Diagnosis not present

## 2024-04-28 DIAGNOSIS — E876 Hypokalemia: Secondary | ICD-10-CM | POA: Diagnosis not present

## 2024-04-28 DIAGNOSIS — R000 Tachycardia, unspecified: Secondary | ICD-10-CM | POA: Diagnosis not present

## 2024-04-28 LAB — RESP PANEL BY RT-PCR (RSV, FLU A&B, COVID)  RVPGX2
Influenza A by PCR: NEGATIVE
Influenza B by PCR: NEGATIVE
Resp Syncytial Virus by PCR: NEGATIVE
SARS Coronavirus 2 by RT PCR: NEGATIVE

## 2024-04-28 LAB — CBC WITH DIFFERENTIAL/PLATELET
Abs Immature Granulocytes: 0.01 K/uL (ref 0.00–0.07)
Basophils Absolute: 0 K/uL (ref 0.0–0.1)
Basophils Relative: 1 %
Eosinophils Absolute: 0 K/uL (ref 0.0–0.5)
Eosinophils Relative: 1 %
HCT: 42 % (ref 36.0–46.0)
Hemoglobin: 13.3 g/dL (ref 12.0–15.0)
Immature Granulocytes: 0 %
Lymphocytes Relative: 37 %
Lymphs Abs: 1.5 K/uL (ref 0.7–4.0)
MCH: 28.9 pg (ref 26.0–34.0)
MCHC: 31.7 g/dL (ref 30.0–36.0)
MCV: 91.1 fL (ref 80.0–100.0)
Monocytes Absolute: 0.3 K/uL (ref 0.1–1.0)
Monocytes Relative: 7 %
Neutro Abs: 2.2 K/uL (ref 1.7–7.7)
Neutrophils Relative %: 54 %
Platelets: 309 K/uL (ref 150–400)
RBC: 4.61 MIL/uL (ref 3.87–5.11)
RDW: 12.8 % (ref 11.5–15.5)
WBC: 3.9 K/uL — ABNORMAL LOW (ref 4.0–10.5)
nRBC: 0 % (ref 0.0–0.2)

## 2024-04-28 LAB — BASIC METABOLIC PANEL WITH GFR
Anion gap: 16 — ABNORMAL HIGH (ref 5–15)
BUN: 13 mg/dL (ref 6–20)
CO2: 21 mmol/L — ABNORMAL LOW (ref 22–32)
Calcium: 8.9 mg/dL (ref 8.9–10.3)
Chloride: 102 mmol/L (ref 98–111)
Creatinine, Ser: 0.92 mg/dL (ref 0.44–1.00)
GFR, Estimated: >60 mL/min
Glucose, Bld: 106 mg/dL — ABNORMAL HIGH (ref 70–99)
Potassium: 3.4 mmol/L — ABNORMAL LOW (ref 3.5–5.1)
Sodium: 138 mmol/L (ref 135–145)

## 2024-04-28 LAB — HCG, SERUM, QUALITATIVE: Preg, Serum: NEGATIVE

## 2024-04-28 MED ORDER — DEXAMETHASONE SOD PHOSPHATE PF 10 MG/ML IJ SOLN: 10.0000 mg | Freq: Once | INTRAMUSCULAR | Status: DC

## 2024-04-28 MED ORDER — ONDANSETRON HCL 4 MG PO TABS: 4.0000 mg | ORAL_TABLET | Freq: Three times a day (TID) | ORAL | 0 refills | Status: AC | PRN

## 2024-04-28 MED ORDER — DIPHENHYDRAMINE HCL 50 MG/ML IJ SOLN
25.0000 mg | Freq: Once | INTRAMUSCULAR | Status: AC
  Administered 2024-04-28: 25 mg via INTRAVENOUS
  Filled 2024-04-28: qty 1

## 2024-04-28 MED ORDER — ONDANSETRON 4 MG PO TBDP: 4.0000 mg | ORAL_TABLET | Freq: Three times a day (TID) | ORAL | 0 refills | Status: AC | PRN

## 2024-04-28 MED ORDER — SODIUM CHLORIDE 0.9 % IV BOLUS
1000.0000 mL | Freq: Once | INTRAVENOUS | Status: AC
  Administered 2024-04-28: 1000 mL via INTRAVENOUS

## 2024-04-28 MED ORDER — POTASSIUM CHLORIDE CRYS ER 20 MEQ PO TBCR
40.0000 meq | EXTENDED_RELEASE_TABLET | Freq: Once | ORAL | Status: AC
  Administered 2024-04-28: 40 meq via ORAL
  Filled 2024-04-28: qty 2

## 2024-04-28 MED ORDER — PROCHLORPERAZINE EDISYLATE 10 MG/2ML IJ SOLN
10.0000 mg | Freq: Once | INTRAMUSCULAR | Status: AC
  Administered 2024-04-28: 10 mg via INTRAVENOUS
  Filled 2024-04-28: qty 2

## 2024-04-28 NOTE — ED Provider Notes (Signed)
 " [place] EMERGENCY DEPARTMENT AT [facility] Provider Note   CSN: [id number] Arrival date & time: 04/28/24  1431     Patient presents with: Hypertension   Elaine Hunt is a 28 y.o. female.   The history is provided by the patient.  Hypertension   She has history of hypertension, GERD, asthma, polycystic ovarian syndrome and comes in because of headache, rapid heart rate, elevated blood pressure.  She states that she had been drinking heavily 2 nights ago and had a hangover yesterday.  She had been vomiting through the day.  Today she is complaining of a headache which is primarily left occipital.  She does endorse photophobia and phonophobia but has not been vomiting today.  She denies fever, chills, sweats.  She checked her blood pressure and noted that it was elevated at 155/102.  She normally checks her blood pressure approximately every other day and usual blood pressures in the range of 130/80.  Blood pressure cuff indicated that her heart rate was high.    Prior to Admission medications  Medication Sig Start Date End Date Taking? Authorizing Provider  benzonatate  (TESSALON ) 100 MG capsule Take 1 capsule (100 mg total) by mouth 3 (three) times daily as needed for cough. Do not take with alcohol or while driving or operating heavy machinery.  May cause drowsiness. 10/27/22   Chandra Harlene LABOR, NP  lidocaine  (XYLOCAINE ) 2 % solution Use as directed 15 mLs in the mouth or throat every 3 (three) hours as needed for mouth pain. Gargle and spit as needed for throat pain 10/27/22   Chandra Harlene LABOR, NP    Allergies: Ibuprofen  and Penicillins    Review of Systems  All other systems reviewed and are negative.   Updated Vital Signs BP (!) 177/108 (BP Location: Right Arm)   Pulse (!) 149   Temp 98.5 F (36.9 C) (Oral)   Resp 16   Ht 5' 1.5 (1.562 m)   Wt 98.9 kg   LMP 04/09/2024   SpO2 98%   BMI 40.52 kg/m   Physical Exam Vitals and nursing note reviewed.    28 year old female, resting comfortably and in no acute distress. Vital signs are significant for elevated blood pressure and heart rate. Oxygen  saturation is 98%, which is normal. Head is normocephalic and atraumatic. PERRLA, EOMI.  There is tenderness to palpation over the left temporalis muscle but no tenderness over the insertion of paracervical muscle. Neck is nontender and supple without adenopathy. Lungs are clear without rales, wheezes, or rhonchi. Chest is nontender. Heart is tachycardic without murmur. Abdomen is soft, flat, nontender. Extremities have no cyanosis or edema, full range of motion is present. Skin is warm and dry without rash. Neurologic: Mental status is normal, cranial nerves are intact, moves all extremities equally.  (all labs ordered are listed, but only abnormal results are displayed) Labs Reviewed  CBC WITH DIFFERENTIAL/PLATELET  BASIC METABOLIC PANEL WITH GFR    EKG: EKG Interpretation Date/Time:  Thursday April 28 2024 14:40:19 EST Ventricular Rate:  142 PR Interval:  130 QRS Duration:  72 QT Interval:  286 QTC Calculation: 439 R Axis:   78  Text Interpretation: Sinus tachycardia Nonspecific T wave abnormality Abnormal ECG When compared with ECG of 14-Oct-2016 22:11, PREVIOUS ECG IS PRESENT Confirmed by Ula Barter [id number]) on 04/28/2024 2:42:30 PM  Radiology: No results found.  Cardiac monitor shows sinus tachycardia, per my interpretation.  Procedures   Medications Ordered in the ED - No  data to display                                  Medical Decision Making Amount and/or Complexity of Data Reviewed Labs: ordered.  Risk Prescription drug management.   Headache and tachycardia and elevated blood pressure following day of significant emesis.  She may have some component of dehydration.  Consider viral illness such as influenza.  However, overall picture is more consistent with a benign headache such as migraine or muscle  contraction headache.  No red flags to suggest serious pathology such as subarachnoid hemorrhage or meningitis.  I have observed her cardiac monitor and she is clearly in sinus tachycardia with variable heart rate ranging from 140-150, not consistent with PSVT.  I reviewed her electrocardiogram, and my interpretation is sinus tachycardia with nonspecific T wave flattening not significantly changed from prior.  I have ordered IV fluids, migraine cocktail including prochlorperazine  and diphenhydramine .  I reviewed her laboratory tests, my interpretation is mild hypokalemia likely secondary to recent vomiting, mildly elevated random glucose level which will need to be followed as an outpatient, not pregnant, mild leukopenia is not felt to be clinically significant, and negative PCR for COVID-19 and influenza and RSV.  She feels much better following above-noted treatment.  I am discharging her with a prescription for ondansetron  oral dissolving tablet.  I am also ordering a dose of dexamethasone  prior to discharge and a dose of oral potassium prior to discharge.     Final diagnoses:  Bad headache  Sinus tachycardia  Nausea and vomiting, unspecified vomiting type  Hypokalemia  Elevated random blood glucose level    ED Discharge Orders          Ordered    ondansetron  (ZOFRAN -ODT) 4 MG disintegrating tablet  Every 8 hours PRN        04/28/24 1719    ondansetron  (ZOFRAN ) 4 MG tablet  Every 8 hours PRN        04/28/24 1719               Raford Lenis, MD 04/28/24 1733  "

## 2024-04-28 NOTE — Discharge Instructions (Addendum)
 Drink plenty of fluids-especially over the next 24 hours.  You may take acetaminophen /or ibuprofen  for your headache if it comes back.  Return to the emergency department if symptoms are not being adequately controlled at home.

## 2024-04-28 NOTE — ED Triage Notes (Signed)
 Pt noted elevated BP at home 155/102 with HA.  Feels like her heart rate is elevated.
